# Patient Record
Sex: Female | Born: 1955 | Race: White | Hispanic: No
Health system: Midwestern US, Academic
[De-identification: ages and names within clinical notes are randomized; demographics above are authoritative.]

## PROBLEM LIST (undated history)

## (undated) DIAGNOSIS — I10 Essential (primary) hypertension: Secondary | ICD-10-CM

---

## 1996-01-03 HISTORY — PX: PARTIAL HYSTERECTOMY: SHX80

## 1999-12-06 ENCOUNTER — Encounter: Payer: Self-pay | Admitting: Internal Medicine

## 1999-12-06 ENCOUNTER — Encounter: Admission: RE | Admit: 1999-12-06 | Discharge: 1999-12-06 | Payer: Self-pay | Admitting: Internal Medicine

## 2000-09-17 ENCOUNTER — Other Ambulatory Visit: Admission: RE | Admit: 2000-09-17 | Discharge: 2000-09-17 | Payer: Self-pay | Admitting: Internal Medicine

## 2001-03-25 ENCOUNTER — Encounter: Payer: Self-pay | Admitting: Internal Medicine

## 2001-03-25 ENCOUNTER — Encounter: Admission: RE | Admit: 2001-03-25 | Discharge: 2001-03-25 | Payer: Self-pay | Admitting: Internal Medicine

## 2001-04-24 ENCOUNTER — Inpatient Hospital Stay (HOSPITAL_COMMUNITY): Admission: RE | Admit: 2001-04-24 | Discharge: 2001-04-27 | Payer: Self-pay | Admitting: Obstetrics and Gynecology

## 2001-09-03 ENCOUNTER — Emergency Department (HOSPITAL_COMMUNITY): Admission: EM | Admit: 2001-09-03 | Discharge: 2001-09-04 | Payer: Self-pay | Admitting: Emergency Medicine

## 2001-10-04 ENCOUNTER — Encounter: Payer: Self-pay | Admitting: Internal Medicine

## 2001-10-04 ENCOUNTER — Encounter: Admission: RE | Admit: 2001-10-04 | Discharge: 2001-10-04 | Payer: Self-pay | Admitting: Internal Medicine

## 2005-02-13 ENCOUNTER — Encounter: Admission: RE | Admit: 2005-02-13 | Discharge: 2005-02-13 | Payer: Self-pay | Admitting: Internal Medicine

## 2005-03-03 ENCOUNTER — Encounter: Admission: RE | Admit: 2005-03-03 | Discharge: 2005-03-03 | Payer: Self-pay | Admitting: Internal Medicine

## 2006-10-03 IMAGING — MG MM MAMMO SCREENING
4 series · 4 of 4 positions shown · non-contrast
Comparison: none

SCREENING MAMMOGRAM:
There is a  dense fibroglandular pattern.  No masses or malignant type calcifications are 
identified.  Compared with prior studies.

[R CC]
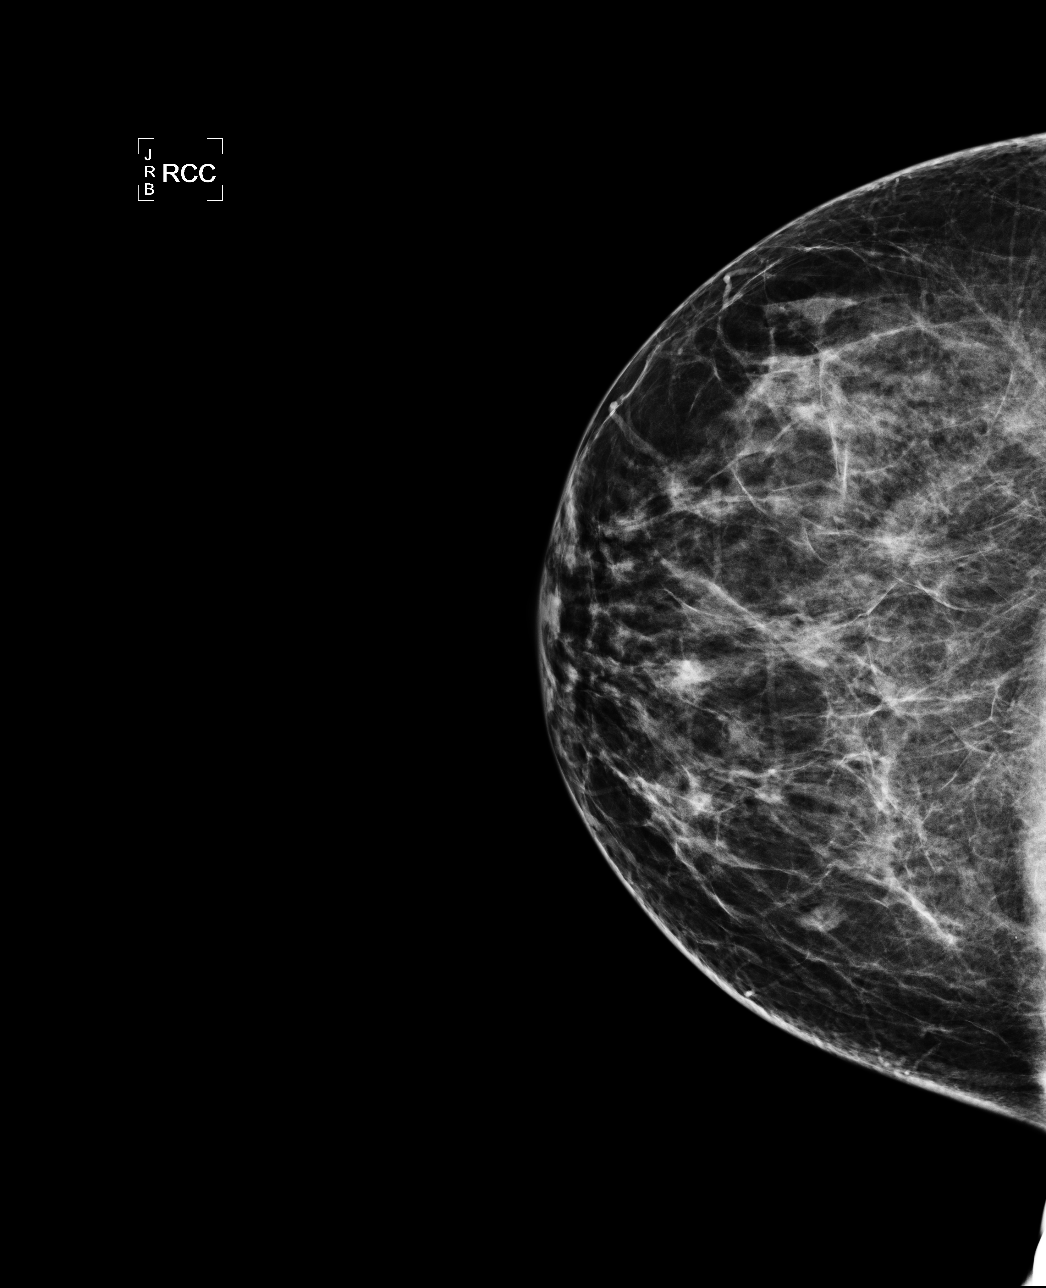

[L CC]
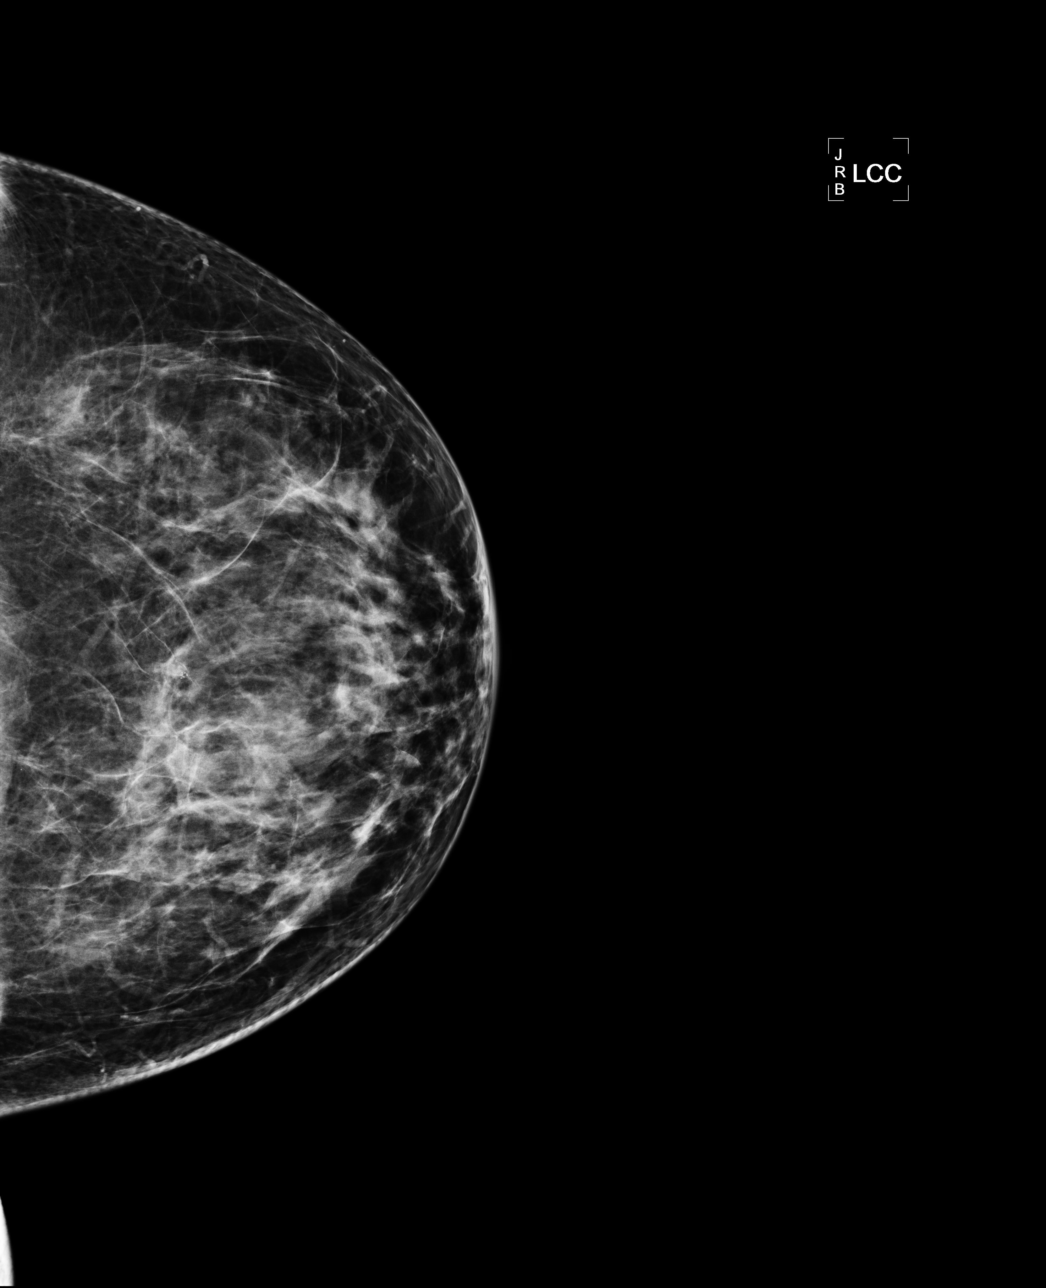

[L MLO]
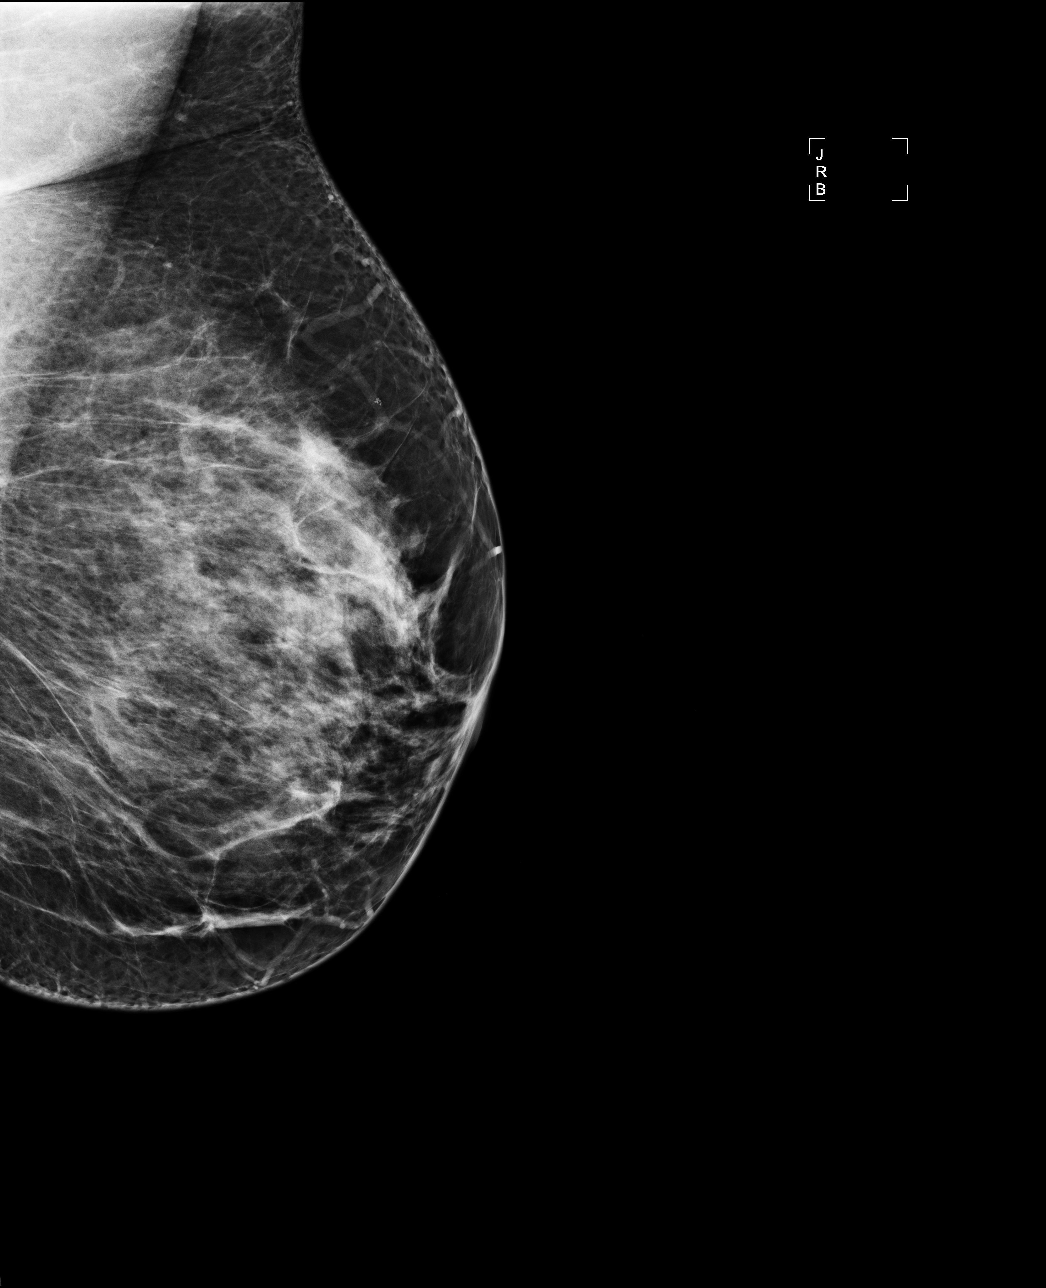

[R MLO]
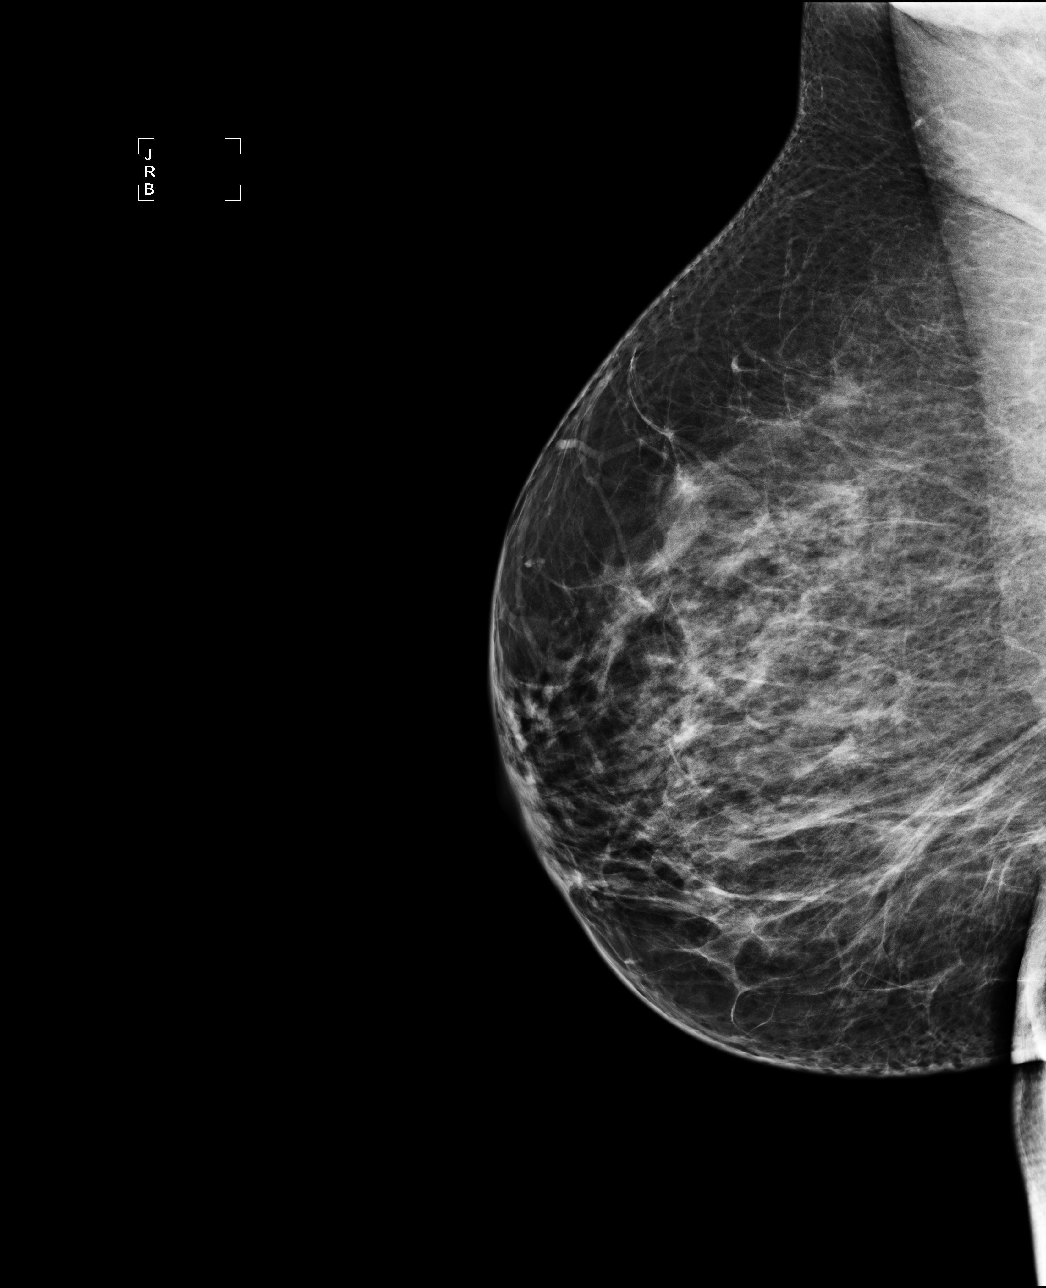

[4 of 4 positions shown; findings below may reference images not displayed]

IMPRESSION: No specific mammographic evidence of malignancy.  Next screening mammogram is recommended in one 
year.

ASSESSMENT: Negative - BI-RADS 1

Screening mammogram in 1 year.

## 2009-04-18 ENCOUNTER — Emergency Department: Payer: Self-pay | Admitting: Emergency Medicine

## 2009-09-03 ENCOUNTER — Emergency Department: Payer: Self-pay | Admitting: Emergency Medicine

## 2012-03-25 ENCOUNTER — Other Ambulatory Visit: Payer: Self-pay

## 2012-03-25 DIAGNOSIS — Z1231 Encounter for screening mammogram for malignant neoplasm of breast: Secondary | ICD-10-CM

## 2012-04-10 ENCOUNTER — Ambulatory Visit
Admission: RE | Admit: 2012-04-10 | Discharge: 2012-04-10 | Disposition: A | Discharge status: Home / Self Care | Payer: 59 | Source: Ambulatory Visit

## 2012-04-10 DIAGNOSIS — Z1231 Encounter for screening mammogram for malignant neoplasm of breast: Secondary | ICD-10-CM

## 2014-02-25 ENCOUNTER — Emergency Department: Payer: Self-pay | Admitting: Emergency Medicine

## 2014-07-22 ENCOUNTER — Other Ambulatory Visit: Payer: Self-pay

## 2014-07-22 ENCOUNTER — Ambulatory Visit
Admission: RE | Admit: 2014-07-22 | Discharge: 2014-07-22 | Disposition: A | Discharge status: Home / Self Care | Payer: Commercial Managed Care - HMO | Source: Ambulatory Visit

## 2014-07-22 DIAGNOSIS — Z1231 Encounter for screening mammogram for malignant neoplasm of breast: Secondary | ICD-10-CM

## 2014-07-24 ENCOUNTER — Other Ambulatory Visit: Payer: Self-pay | Admitting: Internal Medicine

## 2014-07-24 DIAGNOSIS — R928 Other abnormal and inconclusive findings on diagnostic imaging of breast: Secondary | ICD-10-CM

## 2014-10-02 ENCOUNTER — Ambulatory Visit
Admission: RE | Admit: 2014-10-02 | Discharge: 2014-10-02 | Disposition: A | Discharge status: Home / Self Care | Payer: Commercial Managed Care - HMO | Source: Ambulatory Visit | Attending: Internal Medicine | Admitting: Internal Medicine

## 2014-10-02 DIAGNOSIS — R928 Other abnormal and inconclusive findings on diagnostic imaging of breast: Secondary | ICD-10-CM

## 2015-06-05 DIAGNOSIS — I10 Essential (primary) hypertension: Secondary | ICD-10-CM | POA: Insufficient documentation

## 2015-09-13 ENCOUNTER — Other Ambulatory Visit: Payer: Self-pay | Admitting: Internal Medicine

## 2015-09-13 DIAGNOSIS — R1032 Left lower quadrant pain: Secondary | ICD-10-CM

## 2016-03-08 DIAGNOSIS — R1032 Left lower quadrant pain: Secondary | ICD-10-CM | POA: Diagnosis not present

## 2016-03-08 DIAGNOSIS — R7309 Other abnormal glucose: Secondary | ICD-10-CM | POA: Diagnosis not present

## 2016-03-08 DIAGNOSIS — I10 Essential (primary) hypertension: Secondary | ICD-10-CM | POA: Diagnosis not present

## 2016-03-13 ENCOUNTER — Other Ambulatory Visit: Payer: Commercial Managed Care - HMO

## 2016-03-17 ENCOUNTER — Other Ambulatory Visit: Payer: Commercial Managed Care - HMO

## 2016-03-21 ENCOUNTER — Ambulatory Visit
Admission: RE | Admit: 2016-03-21 | Discharge: 2016-03-21 | Disposition: A | Discharge status: Home / Self Care | Payer: Commercial Managed Care - HMO | Source: Ambulatory Visit | Attending: Internal Medicine | Admitting: Internal Medicine

## 2016-03-21 DIAGNOSIS — R1032 Left lower quadrant pain: Secondary | ICD-10-CM

## 2016-03-21 MED ORDER — IOPAMIDOL (ISOVUE-300) INJECTION 61%
125.0000 mL | Freq: Once | INTRAVENOUS | Status: AC | PRN
  Administered 2016-03-21: 125 mL via INTRAVENOUS

## 2016-08-28 ENCOUNTER — Emergency Department
Admission: EM | Admit: 2016-08-28 | Discharge: 2016-08-28 | Disposition: A | Discharge status: Home / Self Care | Payer: 59 | Attending: Emergency Medicine | Admitting: Emergency Medicine

## 2016-08-28 ENCOUNTER — Emergency Department: Payer: 59

## 2016-08-28 ENCOUNTER — Encounter: Payer: Self-pay | Admitting: Emergency Medicine

## 2016-08-28 DIAGNOSIS — Z79899 Other long term (current) drug therapy: Secondary | ICD-10-CM | POA: Insufficient documentation

## 2016-08-28 DIAGNOSIS — X501XXA Overexertion from prolonged static or awkward postures, initial encounter: Secondary | ICD-10-CM | POA: Diagnosis not present

## 2016-08-28 DIAGNOSIS — S82832A Other fracture of upper and lower end of left fibula, initial encounter for closed fracture: Secondary | ICD-10-CM | POA: Diagnosis not present

## 2016-08-28 DIAGNOSIS — S82122A Displaced fracture of lateral condyle of left tibia, initial encounter for closed fracture: Secondary | ICD-10-CM

## 2016-08-28 DIAGNOSIS — I10 Essential (primary) hypertension: Secondary | ICD-10-CM | POA: Diagnosis not present

## 2016-08-28 DIAGNOSIS — F17290 Nicotine dependence, other tobacco product, uncomplicated: Secondary | ICD-10-CM | POA: Insufficient documentation

## 2016-08-28 DIAGNOSIS — S99912A Unspecified injury of left ankle, initial encounter: Secondary | ICD-10-CM | POA: Diagnosis present

## 2016-08-28 DIAGNOSIS — Y999 Unspecified external cause status: Secondary | ICD-10-CM | POA: Diagnosis not present

## 2016-08-28 DIAGNOSIS — Y9301 Activity, walking, marching and hiking: Secondary | ICD-10-CM | POA: Diagnosis not present

## 2016-08-28 DIAGNOSIS — Y929 Unspecified place or not applicable: Secondary | ICD-10-CM | POA: Diagnosis not present

## 2016-08-28 DIAGNOSIS — W108XXA Fall (on) (from) other stairs and steps, initial encounter: Secondary | ICD-10-CM | POA: Diagnosis not present

## 2016-08-28 DIAGNOSIS — S82432A Displaced oblique fracture of shaft of left fibula, initial encounter for closed fracture: Secondary | ICD-10-CM

## 2016-08-28 HISTORY — DX: Essential (primary) hypertension: I10

## 2016-08-28 MED ORDER — MELOXICAM 15 MG PO TABS: 15.0000 mg | ORAL_TABLET | Freq: Every day | ORAL | 0 refills | Status: DC

## 2016-08-28 MED ORDER — OXYCODONE HCL 5 MG PO TABS
5.0000 mg | ORAL_TABLET | Freq: Once | ORAL | Status: AC
  Administered 2016-08-28: 5 mg via ORAL
  Filled 2016-08-28: qty 1

## 2016-08-28 MED ORDER — HYDROCODONE-ACETAMINOPHEN 5-325 MG PO TABS: 1.0000 | ORAL_TABLET | ORAL | 0 refills | Status: AC | PRN

## 2016-08-28 NOTE — ED Triage Notes (Signed)
Fell sown step about 30 min ago. Painful swollen L ankle.

## 2016-08-28 NOTE — Discharge Instructions (Signed)
Call and schedule an appointment with Dr. Cleda Mccreedy. Rest, ice, and elevate your leg often throughout the day. Use your crutches. Do not try and bear weight. Return to the ER for symptoms that change or worsen or for new concerns if you are unable to schedule an appointment.

## 2016-08-28 NOTE — ED Notes (Signed)
See triage note. Fell down steps   Twisted left ankle  Positive swelling  Good pulses and sensation

## 2016-08-28 NOTE — ED Provider Notes (Signed)
[facility] Emergency Department Provider Note ____________________________________________  Time seen: Approximately 4:01 PM  I have reviewed the triage vital signs and the nursing notes.   HISTORY  Chief Complaint Ankle Pain   HPI Carolyn Baker is a 61 y.o. female who presents to the emergency department for evaluation of left ankle pain after missing a step and twisting her ankle while walking down her basement stairs. She denies pain elsewhere or striking her head. No history of ankle fracture. No alleviating measures have been attempted for this complaint.   Past Medical History:  Diagnosis Date  . Hypertension     There are no active problems to display for this patient.   History reviewed. No pertinent surgical history.  Prior to Admission medications   Medication Sig Start Date End Date Taking? Authorizing Provider  amLODipine-benazepril (LOTREL) 5-40 MG capsule Take 1 capsule by mouth daily.   Yes [provider]  hydrochlorothiazide (MICROZIDE) 12.5 MG capsule Take 12.5 mg by mouth daily.   Yes [provider]  HYDROcodone-acetaminophen (NORCO/VICODIN) 5-325 MG tablet Take 1 tablet by mouth every 4 (four) hours as needed for moderate pain. 08/28/16 08/28/17  Kalie Cabral, Johnette Abraham B, FNP  meloxicam (MOBIC) 15 MG tablet Take 1 tablet (15 mg total) by mouth daily. 08/28/16   Victorino Dike, FNP    Allergies Patient has no known allergies.  No family history on file.  Social History Social History  Substance Use Topics  . Smoking status: Current Every Day Smoker    Types: Cigarettes  . Smokeless tobacco: Not on file  . Alcohol use Not on file    Review of Systems Constitutional: Negative for recent illness. Cardiovascular: Negative for chest pain Respiratory: Negative for shortness of breath. Musculoskeletal: Positive for left foot and ankle pain. Skin: Negative for open wound.  Neurological: Negative for loss of  consciousness, numbness, or paresthesias.  ____________________________________________   PHYSICAL EXAM:  VITAL SIGNS: ED Triage Vitals [08/28/16 1501]  Enc Vitals Group     BP (!) 162/92     Pulse Rate (!) 101     Resp 20     Temp 98.1 F (36.7 C)     Temp Source Oral     SpO2 99 %     Weight 175 lb (79.4 kg)     Height 5\' 3"  (1.6 m)     Head Circumference      Peak Flow      Pain Score 10     Pain Loc      Pain Edu?      Excl. in [place]?     Constitutional: Alert and oriented. Well appearing and in no acute distress. Eyes: Conjunctivae are clear without discharge or drainage.  Head: Atraumatic Neck: Nexus criteria is negative. Respiratory: Breath sounds clear to auscultation throughout. Musculoskeletal: Bony tenderness over the left lateral and medial malleolus without obvious deformity. No tenderness over the midfoot. No tenderness along the proximal fibula or tibia. Full range of motion of the left knee and hip without pain. Neurologic: Alert and oriented 4.  Skin: No open wounds noted over the left ankle or foot.  Psychiatric: Behavior and affect are appropriate.  ____________________________________________   LABS (all labs ordered are listed, but only abnormal results are displayed)  Labs Reviewed - No data to display ____________________________________________  RADIOLOGY  Left ankle demonstrates an acute minimally displaced spiral fracture of the distal left fibula and a small avulsion fracture from the anteriomedial aspect of the  tibia. I, Sherrie George, personally viewed and evaluated these images (plain radiographs) as part of my medical decision making, as well as reviewing the written report by the radiologist. ____________________________________________   PROCEDURES  Procedure(s) performed: Stirrup and posterior OCL applied by ER tech. Patient neurovascularly intact post-application. Initial fracture care provided. Follow-up will be greater than 24  hours.  ____________________________________________   INITIAL IMPRESSION / ASSESSMENT AND PLAN / ED COURSE  Carolyn Baker is a 61 y.o. female who presents to the emergency department for evaluation after sustaining a mechanical, non-syncopal fall while walking down her basement stairs this afternoon. X-ray demonstrates a fracture of both the distal fibula and tibia. She was immobilized with an OCL and given crutches with training. She was instructed to call and schedule a follow-up appointment with podiatry. She will be given a prescription for Norco and meloxicam. She was instructed to return to the emergency department for symptoms that change or worsen if she is unable to be seen by her primary care provider or the orthopedic specialist.  Pertinent labs & imaging results that were available during my care of the patient were reviewed by me and considered in my medical decision making (see chart for details).  _________________________________________   FINAL CLINICAL IMPRESSION(S) / ED DIAGNOSES  Final diagnoses:  Closed displaced oblique fracture of shaft of left fibula, initial encounter  Avulsion fracture of lateral condyle of left tibia, closed, initial encounter    Discharge Medication List as of 08/28/2016  3:58 PM    START taking these medications   Details  HYDROcodone-acetaminophen (NORCO/VICODIN) 5-325 MG tablet Take 1 tablet by mouth every 4 (four) hours as needed for moderate pain., Starting Mon 08/28/2016, Until Tue 08/28/2017, Print    meloxicam (MOBIC) 15 MG tablet Take 1 tablet (15 mg total) by mouth daily., Starting Mon 08/28/2016, Print        If controlled substance prescribed during this visit, 12 month history viewed on the [place] prior to issuing an initial prescription for Schedule II or III opiod.    Victorino Dike, FNP 08/28/16 [place], [place], MD 08/30/16 [phone]

## 2016-08-31 ENCOUNTER — Other Ambulatory Visit: Payer: Self-pay | Admitting: Podiatry

## 2016-08-31 ENCOUNTER — Ambulatory Visit
Admission: RE | Admit: 2016-08-31 | Discharge: 2016-08-31 | Disposition: A | Discharge status: Home / Self Care | Payer: 59 | Source: Ambulatory Visit | Attending: Podiatry | Admitting: Podiatry

## 2016-08-31 DIAGNOSIS — M79605 Pain in left leg: Secondary | ICD-10-CM | POA: Diagnosis not present

## 2016-08-31 DIAGNOSIS — M7989 Other specified soft tissue disorders: Secondary | ICD-10-CM

## 2016-08-31 DIAGNOSIS — S82842A Displaced bimalleolar fracture of left lower leg, initial encounter for closed fracture: Secondary | ICD-10-CM | POA: Diagnosis not present

## 2016-08-31 DIAGNOSIS — I824Z2 Acute embolism and thrombosis of unspecified deep veins of left distal lower extremity: Secondary | ICD-10-CM

## 2016-09-07 ENCOUNTER — Ambulatory Visit
Admission: RE | Admit: 2016-09-07 | Discharge: 2016-09-07 | Disposition: A | Discharge status: Home / Self Care | Payer: 59 | Source: Ambulatory Visit | Attending: Podiatry | Admitting: Podiatry

## 2016-09-07 DIAGNOSIS — I82402 Acute embolism and thrombosis of unspecified deep veins of left lower extremity: Secondary | ICD-10-CM | POA: Diagnosis not present

## 2016-09-07 DIAGNOSIS — Z09 Encounter for follow-up examination after completed treatment for conditions other than malignant neoplasm: Secondary | ICD-10-CM | POA: Insufficient documentation

## 2016-09-07 DIAGNOSIS — I824Z2 Acute embolism and thrombosis of unspecified deep veins of left distal lower extremity: Secondary | ICD-10-CM | POA: Diagnosis not present

## 2016-09-07 DIAGNOSIS — Z86718 Personal history of other venous thrombosis and embolism: Secondary | ICD-10-CM | POA: Diagnosis not present

## 2016-09-08 DIAGNOSIS — Z79899 Other long term (current) drug therapy: Secondary | ICD-10-CM | POA: Diagnosis not present

## 2016-09-08 DIAGNOSIS — I10 Essential (primary) hypertension: Secondary | ICD-10-CM | POA: Diagnosis not present

## 2016-09-08 DIAGNOSIS — I82402 Acute embolism and thrombosis of unspecified deep veins of left lower extremity: Secondary | ICD-10-CM | POA: Diagnosis not present

## 2016-09-08 DIAGNOSIS — S8292XS Unspecified fracture of left lower leg, sequela: Secondary | ICD-10-CM | POA: Diagnosis not present

## 2016-09-16 DIAGNOSIS — M25472 Effusion, left ankle: Secondary | ICD-10-CM | POA: Diagnosis not present

## 2016-09-16 DIAGNOSIS — S9002XA Contusion of left ankle, initial encounter: Secondary | ICD-10-CM | POA: Diagnosis not present

## 2016-09-16 DIAGNOSIS — S82402A Unspecified fracture of shaft of left fibula, initial encounter for closed fracture: Secondary | ICD-10-CM | POA: Diagnosis not present

## 2016-09-16 DIAGNOSIS — S82832D Other fracture of upper and lower end of left fibula, subsequent encounter for closed fracture with routine healing: Secondary | ICD-10-CM | POA: Diagnosis not present

## 2016-09-16 DIAGNOSIS — M25572 Pain in left ankle and joints of left foot: Secondary | ICD-10-CM | POA: Diagnosis not present

## 2016-09-16 DIAGNOSIS — I82402 Acute embolism and thrombosis of unspecified deep veins of left lower extremity: Secondary | ICD-10-CM | POA: Diagnosis not present

## 2016-09-16 DIAGNOSIS — S8255XA Nondisplaced fracture of medial malleolus of left tibia, initial encounter for closed fracture: Secondary | ICD-10-CM | POA: Diagnosis not present

## 2016-09-16 DIAGNOSIS — S8255XD Nondisplaced fracture of medial malleolus of left tibia, subsequent encounter for closed fracture with routine healing: Secondary | ICD-10-CM | POA: Diagnosis not present

## 2016-09-21 DIAGNOSIS — M25572 Pain in left ankle and joints of left foot: Secondary | ICD-10-CM | POA: Diagnosis not present

## 2016-09-21 DIAGNOSIS — S82842D Displaced bimalleolar fracture of left lower leg, subsequent encounter for closed fracture with routine healing: Secondary | ICD-10-CM | POA: Diagnosis not present

## 2016-09-21 DIAGNOSIS — I824Z2 Acute embolism and thrombosis of unspecified deep veins of left distal lower extremity: Secondary | ICD-10-CM | POA: Diagnosis not present

## 2016-10-11 DIAGNOSIS — Z0000 Encounter for general adult medical examination without abnormal findings: Secondary | ICD-10-CM | POA: Diagnosis not present

## 2016-10-11 DIAGNOSIS — I10 Essential (primary) hypertension: Secondary | ICD-10-CM | POA: Diagnosis not present

## 2016-10-11 DIAGNOSIS — I82402 Acute embolism and thrombosis of unspecified deep veins of left lower extremity: Secondary | ICD-10-CM | POA: Diagnosis not present

## 2016-10-12 DIAGNOSIS — I824Z2 Acute embolism and thrombosis of unspecified deep veins of left distal lower extremity: Secondary | ICD-10-CM | POA: Diagnosis not present

## 2016-10-12 DIAGNOSIS — S82842D Displaced bimalleolar fracture of left lower leg, subsequent encounter for closed fracture with routine healing: Secondary | ICD-10-CM | POA: Diagnosis not present

## 2016-10-20 ENCOUNTER — Other Ambulatory Visit: Payer: Self-pay | Admitting: Internal Medicine

## 2016-10-20 DIAGNOSIS — Z1231 Encounter for screening mammogram for malignant neoplasm of breast: Secondary | ICD-10-CM

## 2016-11-08 DIAGNOSIS — M25672 Stiffness of left ankle, not elsewhere classified: Secondary | ICD-10-CM | POA: Diagnosis not present

## 2016-11-08 DIAGNOSIS — S82842D Displaced bimalleolar fracture of left lower leg, subsequent encounter for closed fracture with routine healing: Secondary | ICD-10-CM | POA: Diagnosis not present

## 2016-11-08 DIAGNOSIS — M25572 Pain in left ankle and joints of left foot: Secondary | ICD-10-CM | POA: Diagnosis not present

## 2016-11-14 DIAGNOSIS — S82842D Displaced bimalleolar fracture of left lower leg, subsequent encounter for closed fracture with routine healing: Secondary | ICD-10-CM | POA: Diagnosis not present

## 2016-11-17 DIAGNOSIS — M25672 Stiffness of left ankle, not elsewhere classified: Secondary | ICD-10-CM | POA: Diagnosis not present

## 2016-11-17 DIAGNOSIS — S82842D Displaced bimalleolar fracture of left lower leg, subsequent encounter for closed fracture with routine healing: Secondary | ICD-10-CM | POA: Diagnosis not present

## 2016-11-17 DIAGNOSIS — M6281 Muscle weakness (generalized): Secondary | ICD-10-CM | POA: Diagnosis not present

## 2016-11-20 DIAGNOSIS — M25572 Pain in left ankle and joints of left foot: Secondary | ICD-10-CM | POA: Diagnosis not present

## 2016-11-20 DIAGNOSIS — M25672 Stiffness of left ankle, not elsewhere classified: Secondary | ICD-10-CM | POA: Diagnosis not present

## 2016-11-20 DIAGNOSIS — S82842D Displaced bimalleolar fracture of left lower leg, subsequent encounter for closed fracture with routine healing: Secondary | ICD-10-CM | POA: Diagnosis not present

## 2016-11-22 ENCOUNTER — Ambulatory Visit
Admission: RE | Admit: 2016-11-22 | Discharge: 2016-11-22 | Disposition: A | Discharge status: Home / Self Care | Payer: 59 | Source: Ambulatory Visit | Attending: Internal Medicine | Admitting: Internal Medicine

## 2016-11-22 DIAGNOSIS — Z1231 Encounter for screening mammogram for malignant neoplasm of breast: Secondary | ICD-10-CM | POA: Diagnosis not present

## 2016-11-27 DIAGNOSIS — S82842D Displaced bimalleolar fracture of left lower leg, subsequent encounter for closed fracture with routine healing: Secondary | ICD-10-CM | POA: Diagnosis not present

## 2016-11-27 DIAGNOSIS — M25572 Pain in left ankle and joints of left foot: Secondary | ICD-10-CM | POA: Diagnosis not present

## 2016-11-27 DIAGNOSIS — M25672 Stiffness of left ankle, not elsewhere classified: Secondary | ICD-10-CM | POA: Diagnosis not present

## 2016-11-28 DIAGNOSIS — S82842D Displaced bimalleolar fracture of left lower leg, subsequent encounter for closed fracture with routine healing: Secondary | ICD-10-CM | POA: Diagnosis not present

## 2016-11-28 DIAGNOSIS — M25572 Pain in left ankle and joints of left foot: Secondary | ICD-10-CM | POA: Diagnosis not present

## 2016-11-29 DIAGNOSIS — S82842D Displaced bimalleolar fracture of left lower leg, subsequent encounter for closed fracture with routine healing: Secondary | ICD-10-CM | POA: Diagnosis not present

## 2016-11-29 DIAGNOSIS — M25572 Pain in left ankle and joints of left foot: Secondary | ICD-10-CM | POA: Diagnosis not present

## 2016-11-29 DIAGNOSIS — M25672 Stiffness of left ankle, not elsewhere classified: Secondary | ICD-10-CM | POA: Diagnosis not present

## 2016-12-04 DIAGNOSIS — M25672 Stiffness of left ankle, not elsewhere classified: Secondary | ICD-10-CM | POA: Diagnosis not present

## 2016-12-04 DIAGNOSIS — S82842D Displaced bimalleolar fracture of left lower leg, subsequent encounter for closed fracture with routine healing: Secondary | ICD-10-CM | POA: Diagnosis not present

## 2016-12-04 DIAGNOSIS — M25572 Pain in left ankle and joints of left foot: Secondary | ICD-10-CM | POA: Diagnosis not present

## 2017-01-11 DIAGNOSIS — I82402 Acute embolism and thrombosis of unspecified deep veins of left lower extremity: Secondary | ICD-10-CM | POA: Diagnosis not present

## 2017-01-11 DIAGNOSIS — E559 Vitamin D deficiency, unspecified: Secondary | ICD-10-CM | POA: Diagnosis not present

## 2017-01-11 DIAGNOSIS — I10 Essential (primary) hypertension: Secondary | ICD-10-CM | POA: Diagnosis not present

## 2017-05-14 DIAGNOSIS — M25572 Pain in left ankle and joints of left foot: Secondary | ICD-10-CM | POA: Diagnosis not present

## 2017-05-14 DIAGNOSIS — Z1389 Encounter for screening for other disorder: Secondary | ICD-10-CM | POA: Diagnosis not present

## 2017-05-14 DIAGNOSIS — Z86718 Personal history of other venous thrombosis and embolism: Secondary | ICD-10-CM | POA: Diagnosis not present

## 2017-05-14 DIAGNOSIS — I10 Essential (primary) hypertension: Secondary | ICD-10-CM | POA: Diagnosis not present

## 2017-10-19 ENCOUNTER — Other Ambulatory Visit: Payer: Self-pay | Admitting: Internal Medicine

## 2017-11-15 ENCOUNTER — Other Ambulatory Visit: Payer: Self-pay | Admitting: Internal Medicine

## 2017-11-15 MED ORDER — AMLODIPINE BESY-BENAZEPRIL HCL 5-40 MG PO CAPS: 1.0000 | ORAL_CAPSULE | Freq: Every day | ORAL | 1 refills | Status: DC

## 2017-11-15 MED ORDER — HYDROCHLOROTHIAZIDE 12.5 MG PO CAPS: 12.5000 mg | ORAL_CAPSULE | Freq: Every day | ORAL | 1 refills | Status: DC

## 2017-11-22 ENCOUNTER — Other Ambulatory Visit (HOSPITAL_COMMUNITY)
Admission: RE | Admit: 2017-11-22 | Discharge: 2017-11-22 | Disposition: A | Discharge status: Home / Self Care | Payer: 59 | Source: Ambulatory Visit | Attending: Internal Medicine | Admitting: Internal Medicine

## 2017-11-22 ENCOUNTER — Other Ambulatory Visit: Payer: Self-pay

## 2017-11-22 ENCOUNTER — Encounter: Payer: Self-pay | Admitting: Internal Medicine

## 2017-11-22 ENCOUNTER — Ambulatory Visit (INDEPENDENT_AMBULATORY_CARE_PROVIDER_SITE_OTHER): Payer: 59 | Admitting: Internal Medicine

## 2017-11-22 VITALS — BP 150/88 | HR 86 | Temp 97.7°F | Ht 63.75 in | Wt 199.4 lb

## 2017-11-22 DIAGNOSIS — Z90710 Acquired absence of both cervix and uterus: Secondary | ICD-10-CM | POA: Diagnosis not present

## 2017-11-22 DIAGNOSIS — Z01419 Encounter for gynecological examination (general) (routine) without abnormal findings: Secondary | ICD-10-CM | POA: Insufficient documentation

## 2017-11-22 DIAGNOSIS — I10 Essential (primary) hypertension: Secondary | ICD-10-CM | POA: Diagnosis not present

## 2017-11-22 DIAGNOSIS — M25572 Pain in left ankle and joints of left foot: Secondary | ICD-10-CM | POA: Insufficient documentation

## 2017-11-22 DIAGNOSIS — K921 Melena: Secondary | ICD-10-CM | POA: Diagnosis not present

## 2017-11-22 DIAGNOSIS — Z86010 Personal history of colonic polyps: Secondary | ICD-10-CM | POA: Diagnosis not present

## 2017-11-22 DIAGNOSIS — Z1239 Encounter for other screening for malignant neoplasm of breast: Secondary | ICD-10-CM | POA: Diagnosis not present

## 2017-11-22 DIAGNOSIS — H538 Other visual disturbances: Secondary | ICD-10-CM | POA: Diagnosis not present

## 2017-11-22 DIAGNOSIS — Z1212 Encounter for screening for malignant neoplasm of rectum: Secondary | ICD-10-CM

## 2017-11-22 DIAGNOSIS — Z0000 Encounter for general adult medical examination without abnormal findings: Secondary | ICD-10-CM | POA: Diagnosis not present

## 2017-11-22 LAB — POCT URINALYSIS DIPSTICK
Bilirubin, UA: NEGATIVE
Glucose, UA: NEGATIVE
KETONES UA: NEGATIVE
NITRITE UA: NEGATIVE
PROTEIN UA: NEGATIVE
SPEC GRAV UA: 1.025 (ref 1.010–1.025)
Urobilinogen, UA: 0.2 E.U./dL
pH, UA: 5.5 (ref 5.0–8.0)

## 2017-11-22 LAB — POCT UA - MICROALBUMIN
ALBUMIN/CREATININE RATIO, URINE, POC: 30
Creatinine, POC: 300 mg/dL
Microalbumin Ur, POC: 30 mg/L

## 2017-11-22 MED ORDER — AMLODIPINE BESY-BENAZEPRIL HCL 5-40 MG PO CAPS: 1.0000 | ORAL_CAPSULE | Freq: Every day | ORAL | 1 refills | Status: DC

## 2017-11-22 NOTE — Patient Instructions (Signed)
DASH Eating Plan DASH stands for "Dietary Approaches to Stop Hypertension." The DASH eating plan is a healthy eating plan that has been shown to reduce high blood pressure (hypertension). It may also reduce your risk for type 2 diabetes, heart disease, and stroke. The DASH eating plan may also help with weight loss. What are tips for following this plan? General guidelines  Avoid eating more than 2,300 mg (milligrams) of salt (sodium) a day. If you have hypertension, you may need to reduce your sodium intake to 1,500 mg a day.  Limit alcohol intake to no more than 1 drink a day for nonpregnant women and 2 drinks a day for men. One drink equals 12 oz of beer, 5 oz of wine, or 1 oz of hard liquor.  Work with your health care provider to maintain a healthy body weight or to lose weight. Ask what an ideal weight is for you.  Get at least 30 minutes of exercise that causes your heart to beat faster (aerobic exercise) most days of the week. Activities may include walking, swimming, or biking.  Work with your health care provider or diet and nutrition specialist (dietitian) to adjust your eating plan to your individual calorie needs. Reading food labels  Check food labels for the amount of sodium per serving. Choose foods with less than 5 percent of the Daily Value of sodium. Generally, foods with less than 300 mg of sodium per serving fit into this eating plan.  To find whole grains, look for the word "whole" as the first word in the ingredient list. Shopping  Buy products labeled as "low-sodium" or "no salt added."  Buy fresh foods. Avoid canned foods and premade or frozen meals. Cooking  Avoid adding salt when cooking. Use salt-free seasonings or herbs instead of table salt or sea salt. Check with your health care provider or pharmacist before using salt substitutes.  Do not fry foods. Cook foods using healthy methods such as baking, boiling, grilling, and broiling instead.  Cook with  heart-healthy oils, such as olive, canola, soybean, or sunflower oil. Meal planning   Eat a balanced diet that includes: ? 5 or more servings of fruits and vegetables each day. At each meal, try to fill half of your plate with fruits and vegetables. ? Up to 6-8 servings of whole grains each day. ? Less than 6 oz of lean meat, poultry, or fish each day. A 3-oz serving of meat is about the same size as a deck of cards. One egg equals 1 oz. ? 2 servings of low-fat dairy each day. ? A serving of nuts, seeds, or beans 5 times each week. ? Heart-healthy fats. Healthy fats called Omega-3 fatty acids are found in foods such as flaxseeds and coldwater fish, like sardines, salmon, and mackerel.  Limit how much you eat of the following: ? Canned or prepackaged foods. ? Food that is high in trans fat, such as fried foods. ? Food that is high in saturated fat, such as fatty meat. ? Sweets, desserts, sugary drinks, and other foods with added sugar. ? Full-fat dairy products.  Do not salt foods before eating.  Try to eat at least 2 vegetarian meals each week.  Eat more home-cooked food and less restaurant, buffet, and fast food.  When eating at a restaurant, ask that your food be prepared with less salt or no salt, if possible. What foods are recommended? The items listed may not be a complete list. Talk with your dietitian about what   dietary choices are best for you. Grains Whole-grain or whole-wheat bread. Whole-grain or whole-wheat pasta. Brown rice. Oatmeal. Quinoa. Bulgur. Whole-grain and low-sodium cereals. Pita bread. Low-fat, low-sodium crackers. Whole-wheat flour tortillas. Vegetables Fresh or frozen vegetables (raw, steamed, roasted, or grilled). Low-sodium or reduced-sodium tomato and vegetable juice. Low-sodium or reduced-sodium tomato sauce and tomato paste. Low-sodium or reduced-sodium canned vegetables. Fruits All fresh, dried, or frozen fruit. Canned fruit in natural juice (without  added sugar). Meat and other protein foods Skinless chicken or turkey. Ground chicken or turkey. Pork with fat trimmed off. Fish and seafood. Egg whites. Dried beans, peas, or lentils. Unsalted nuts, nut butters, and seeds. Unsalted canned beans. Lean cuts of beef with fat trimmed off. Low-sodium, lean deli meat. Dairy Low-fat (1%) or fat-free (skim) milk. Fat-free, low-fat, or reduced-fat cheeses. Nonfat, low-sodium ricotta or cottage cheese. Low-fat or nonfat yogurt. Low-fat, low-sodium cheese. Fats and oils Soft margarine without trans fats. Vegetable oil. Low-fat, reduced-fat, or light mayonnaise and salad dressings (reduced-sodium). Canola, safflower, olive, soybean, and sunflower oils. Avocado. Seasoning and other foods Herbs. Spices. Seasoning mixes without salt. Unsalted popcorn and pretzels. Fat-free sweets. What foods are not recommended? The items listed may not be a complete list. Talk with your dietitian about what dietary choices are best for you. Grains Baked goods made with fat, such as croissants, muffins, or some breads. Dry pasta or rice meal packs. Vegetables Creamed or fried vegetables. Vegetables in a cheese sauce. Regular canned vegetables (not low-sodium or reduced-sodium). Regular canned tomato sauce and paste (not low-sodium or reduced-sodium). Regular tomato and vegetable juice (not low-sodium or reduced-sodium). Pickles. Olives. Fruits Canned fruit in a light or heavy syrup. Fried fruit. Fruit in cream or butter sauce. Meat and other protein foods Fatty cuts of meat. Ribs. Fried meat. Bacon. Sausage. Bologna and other processed lunch meats. Salami. Fatback. Hotdogs. Bratwurst. Salted nuts and seeds. Canned beans with added salt. Canned or smoked fish. Whole eggs or egg yolks. Chicken or turkey with skin. Dairy Whole or 2% milk, cream, and half-and-half. Whole or full-fat cream cheese. Whole-fat or sweetened yogurt. Full-fat cheese. Nondairy creamers. Whipped toppings.  Processed cheese and cheese spreads. Fats and oils Butter. Stick margarine. Lard. Shortening. Ghee. Bacon fat. Tropical oils, such as coconut, palm kernel, or palm oil. Seasoning and other foods Salted popcorn and pretzels. Onion salt, garlic salt, seasoned salt, table salt, and sea salt. Worcestershire sauce. Tartar sauce. Barbecue sauce. Teriyaki sauce. Soy sauce, including reduced-sodium. Steak sauce. Canned and packaged gravies. Fish sauce. Oyster sauce. Cocktail sauce. Horseradish that you find on the shelf. Ketchup. Mustard. Meat flavorings and tenderizers. Bouillon cubes. Hot sauce and Tabasco sauce. Premade or packaged marinades. Premade or packaged taco seasonings. Relishes. Regular salad dressings. Where to find more information:  National Heart, Lung, and Blood Institute: www.nhlbi.nih.gov  American Heart Association: www.heart.org Summary  The DASH eating plan is a healthy eating plan that has been shown to reduce high blood pressure (hypertension). It may also reduce your risk for type 2 diabetes, heart disease, and stroke.  With the DASH eating plan, you should limit salt (sodium) intake to 2,300 mg a day. If you have hypertension, you may need to reduce your sodium intake to 1,500 mg a day.  When on the DASH eating plan, aim to eat more fresh fruits and vegetables, whole grains, lean proteins, low-fat dairy, and heart-healthy fats.  Work with your health care provider or diet and nutrition specialist (dietitian) to adjust your eating plan to your individual   calorie needs. This information is not intended to replace advice given to you by your health care provider. Make sure you discuss any questions you have with your health care provider. Document Released: 12/08/2010 Document Revised: 12/13/2015 Document Reviewed: 12/13/2015 Elsevier Interactive Patient Education  2018 Elsevier Inc.  

## 2017-11-23 LAB — CMP14+EGFR
ALBUMIN: 4.1 g/dL (ref 3.6–4.8)
ALT: 22 IU/L (ref 0–32)
AST: 17 IU/L (ref 0–40)
Albumin/Globulin Ratio: 1.2 (ref 1.2–2.2)
Alkaline Phosphatase: 58 IU/L (ref 39–117)
BUN/Creatinine Ratio: 15 (ref 12–28)
BUN: 15 mg/dL (ref 8–27)
Bilirubin Total: 0.4 mg/dL (ref 0.0–1.2)
CALCIUM: 9.4 mg/dL (ref 8.7–10.3)
CHLORIDE: 103 mmol/L (ref 96–106)
CO2: 21 mmol/L (ref 20–29)
Creatinine, Ser: 1.03 mg/dL — ABNORMAL HIGH (ref 0.57–1.00)
GFR, EST AFRICAN AMERICAN: 68 mL/min/{1.73_m2} (ref >59)
GFR, EST NON AFRICAN AMERICAN: 59 mL/min/{1.73_m2} — AB (ref >59)
GLUCOSE: 97 mg/dL (ref 65–99)
Globulin, Total: 3.5 g/dL (ref 1.5–4.5)
Potassium: 4.4 mmol/L (ref 3.5–5.2)
Sodium: 140 mmol/L (ref 134–144)
TOTAL PROTEIN: 7.6 g/dL (ref 6.0–8.5)

## 2017-11-23 LAB — CBC
Hematocrit: 43.2 % (ref 34.0–46.6)
Hemoglobin: 14.5 g/dL (ref 11.1–15.9)
MCH: 31 pg (ref 26.6–33.0)
MCHC: 33.6 g/dL (ref 31.5–35.7)
MCV: 92 fL (ref 79–97)
Platelets: 191 10*3/uL (ref 150–450)
RBC: 4.68 x10E6/uL (ref 3.77–5.28)
RDW: 13.2 % (ref 12.3–15.4)
WBC: 3.9 10*3/uL (ref 3.4–10.8)

## 2017-11-23 LAB — LIPID PANEL
Chol/HDL Ratio: 3.7 ratio (ref 0.0–4.4)
Cholesterol, Total: 197 mg/dL (ref 100–199)
HDL: 53 mg/dL (ref >39)
LDL Calculated: 132 mg/dL — ABNORMAL HIGH (ref 0–99)
Triglycerides: 61 mg/dL (ref 0–149)
VLDL Cholesterol Cal: 12 mg/dL (ref 5–40)

## 2017-11-23 LAB — HEMOGLOBIN A1C
Est. average glucose Bld gHb Est-mCnc: 114 mg/dL
Hgb A1c MFr Bld: 5.6 % (ref 4.8–5.6)

## 2017-11-23 LAB — HIV ANTIBODY (ROUTINE TESTING W REFLEX): HIV SCREEN 4TH GENERATION: NONREACTIVE

## 2017-11-23 NOTE — Progress Notes (Signed)
Your liver and kidney fxn are stable. However, it appears your kidney function has decreased since last visit. Please increase water intake. Your blood count is normal. Your LDL, bad chol, is 132.  Ideally, this should be less than 100. Please start a regular exercise program - 30 minutes four to five days weekly. I also suggest you avoid fried foods. You are NOT prediabetic. You are HIV negative. Happy Thanksgiving to you and your family!

## 2017-11-24 LAB — POC HEMOCCULT BLD/STL (OFFICE/1-CARD/DIAGNOSTIC)

## 2017-11-24 NOTE — Progress Notes (Signed)
Subjective:     Patient ID: Carolyn Baker , female    DOB: 24-Feb-1955 , 62 y.o.   MRN: [id number]   Chief Complaint  Patient presents with  . Annual Exam  . Hypertension    HPI  She is here today for a full physical examination. She is no longer followed by GYN.   Hypertension  This is a chronic problem. The current episode started more than 1 year ago. The problem has been gradually improving since onset. The problem is uncontrolled. Pertinent negatives include no blurred vision, chest pain, headaches, neck pain, PND or shortness of breath. Risk factors for coronary artery disease include obesity and sedentary lifestyle.   She admits that she has not been exercising recently. She is helping to care for a friend with leukemia. She takes him to his treatments at least four days per week. She states she has yet to take her meds today.   Past Medical History:  Diagnosis Date  . Hypertension      Family History  Problem Relation Age of Onset  . Stroke Mother   . Dementia Mother   . Prostate cancer Father   . Breast cancer Neg Hx      No LMP recorded. Patient has had a hysterectomy..  Negative for: breast discharge, breast lump(s), breast pain and breast self exam. Associated symptoms include abnormal vaginal bleeding. Pertinent negatives include abnormal bleeding (hematology), anxiety, decreased libido, depression, difficulty falling sleep, dyspareunia, history of infertility, nocturia, sexual dysfunction, sleep disturbances, urinary incontinence, urinary urgency, vaginal discharge and vaginal itching. Diet regular.The patient states her exercise level is    . The patient's tobacco use is:  Social History   Tobacco Use  Smoking Status Never Smoker  Smokeless Tobacco Never Used  . She has been exposed to passive smoke. The patient's alcohol use is:  Social History   Substance and Sexual Activity  Alcohol Use Not on file    Current Outpatient Medications:  .   amLODipine-benazepril (LOTREL) 5-40 MG capsule, Take 1 capsule by mouth daily., Disp: 90 capsule, Rfl: 1 .  Cholecalciferol (VITAMIN D3) 125 MCG (5000 UT) CAPS, Take by mouth., Disp: , Rfl:  .  hydrochlorothiazide (MICROZIDE) 12.5 MG capsule, Take 1 capsule (12.5 mg total) by mouth daily., Disp: 90 capsule, Rfl: 1   No Known Allergies   Review of Systems  Constitutional: Negative.   HENT: Negative.   Eyes: Positive for visual disturbance (She reports that she has noticed that her vision has become more blurred.  She has trouble reading things farther away, and sometimes close up. does not have an established eye doctor. No associated h/a. ). Negative for blurred vision.  Respiratory: Negative.  Negative for shortness of breath.   Cardiovascular: Negative.  Negative for chest pain and PND.  Gastrointestinal: Positive for blood in stool (she has seen blood in her stools on occasion. has had colonoscopy in the past. She sees it 2-3 times per month. Denies h/o hemorrhoids. ).  Endocrine: Negative.   Genitourinary: Negative.   Musculoskeletal: Negative.  Negative for neck pain.  Allergic/Immunologic: Negative.   Neurological: Negative.  Negative for headaches.  Hematological: Negative.   Psychiatric/Behavioral: Negative.      Today's Vitals   11/22/17 0913  BP: (!) 150/88  Pulse: 86  Temp: 97.7 F (36.5 C)  TempSrc: Oral  Weight: 199 lb 6.4 oz (90.4 kg)  Height: 5' 3.75" (1.619 m)   Body mass index is 34.5 kg/m.   Objective:  Physical Exam  Constitutional: She is oriented to person, place, and time. She appears well-developed and well-nourished.  HENT:  Head: Normocephalic and atraumatic.  Right Ear: External ear normal.  Left Ear: External ear normal.  Nose: Nose normal.  Mouth/Throat: Oropharynx is clear and moist.  Eyes: Pupils are equal, round, and reactive to light. Conjunctivae and EOM are normal.  Neck: Normal range of motion. Neck supple.  Cardiovascular: Normal rate,  regular rhythm, normal heart sounds and intact distal pulses.  Pulmonary/Chest: Effort normal and breath sounds normal. Right breast exhibits no inverted nipple, no mass, no nipple discharge, no skin change and no tenderness. Left breast exhibits no inverted nipple, no mass, no nipple discharge, no skin change and no tenderness.  Abdominal: Soft. Bowel sounds are normal.  Genitourinary: Vagina normal. Rectal exam shows guaiac negative stool. Pelvic exam was performed with patient supine. There is no rash, tenderness, lesion or injury on the right labia. There is no rash, tenderness, lesion or injury on the left labia. No bleeding in the vagina. No vaginal discharge found.  Genitourinary Comments: Absent cervix, surgically absent uterus  Musculoskeletal: Normal range of motion.  Neurological: She is alert and oriented to person, place, and time. She displays no atrophy (ref hung). She exhibits normal muscle tone. Seizure activity: ref gi hung.  Skin: Skin is warm and dry.  Psychiatric: She has a normal mood and affect.  Nursing note and vitals reviewed.       Assessment And Plan:     1. Routine general medical examination at health care facility  A full exam was performed. Stool heme negative.  Importance of monthly self breast exams was discussed with the patient.  PATIENT HAS BEEN ADVISED TO GET 30-45 MINUTES REGULAR EXERCISE NO LESS THAN FOUR TO FIVE DAYS PER WEEK - BOTH WEIGHTBEARING EXERCISES AND AEROBIC ARE RECOMMENDED.  SHE IS ADVISED TO FOLLOW A HEALTHY DIET WITH AT LEAST SIX FRUITS/VEGGIES PER DAY, DECREASE INTAKE OF RED MEAT, AND TO INCREASE FISH INTAKE TO TWO DAYS PER WEEK.  MEATS/FISH SHOULD NOT BE FRIED, BAKED OR BROILED IS PREFERABLE.  I SUGGEST WEARING SPF 50 SUNSCREEN ON EXPOSED PARTS AND ESPECIALLY WHEN IN THE DIRECT SUNLIGHT FOR AN EXTENDED PERIOD OF TIME.  PLEASE AVOID FAST FOOD RESTAURANTS AND INCREASE YOUR WATER INTAKE.  - CMP14+EGFR - CBC - Lipid panel - Hemoglobin A1c -  HIV antibody (with reflex) - MM Digital Screening; Future  2. Encntr for gyn exam (general) (routine) w/o abn findings  Pap smear performed. Stool heme negative.   - Cytology -Pap Smear  3. Essential hypertension, benign  Uncontrolled. She is encouraged to take meds daily. She is also encouraged to avoid adding salt to her foods and to incorporate more exercise into her daily routine. She will rto in 3 months for re-evaluation.   - EKG 12-Lead - POCT Urinalysis Dipstick (81002) - POCT UA - Microalbumin  4. Blurred vision, bilateral  I will refer her to ophthalmology for further evaluation. She is also encouraged to try reading glasses. We also discussed need to screen for glaucoma given her race and htn.   5. Blood in stool  Stool is guaiac negative today. I will refer her to GI for possible CRC screening since she is having repeat episodes. Of note, she also has h/o colon polyps.   6. Breast cancer screening  - MM Digital Screening; Future        [name], MD

## 2017-11-26 LAB — CYTOLOGY - PAP: Diagnosis: NEGATIVE

## 2017-11-26 NOTE — Progress Notes (Signed)
Pap smear is normal

## 2018-01-08 ENCOUNTER — Ambulatory Visit
Admission: RE | Admit: 2018-01-08 | Discharge: 2018-01-08 | Disposition: A | Discharge status: Home / Self Care | Payer: 59 | Source: Ambulatory Visit | Attending: Internal Medicine | Admitting: Internal Medicine

## 2018-01-08 ENCOUNTER — Ambulatory Visit: Payer: 59

## 2018-01-08 DIAGNOSIS — Z0000 Encounter for general adult medical examination without abnormal findings: Secondary | ICD-10-CM

## 2018-01-08 DIAGNOSIS — Z1239 Encounter for other screening for malignant neoplasm of breast: Secondary | ICD-10-CM

## 2018-01-08 DIAGNOSIS — Z1231 Encounter for screening mammogram for malignant neoplasm of breast: Secondary | ICD-10-CM | POA: Diagnosis not present

## 2018-01-09 DIAGNOSIS — Z1211 Encounter for screening for malignant neoplasm of colon: Secondary | ICD-10-CM | POA: Diagnosis not present

## 2018-01-17 DIAGNOSIS — K635 Polyp of colon: Secondary | ICD-10-CM | POA: Diagnosis not present

## 2018-01-17 DIAGNOSIS — D122 Benign neoplasm of ascending colon: Secondary | ICD-10-CM | POA: Diagnosis not present

## 2018-01-17 DIAGNOSIS — Z1211 Encounter for screening for malignant neoplasm of colon: Secondary | ICD-10-CM | POA: Diagnosis not present

## 2018-01-17 DIAGNOSIS — D120 Benign neoplasm of cecum: Secondary | ICD-10-CM | POA: Diagnosis not present

## 2018-02-18 ENCOUNTER — Telehealth: Payer: Self-pay

## 2018-02-18 NOTE — Telephone Encounter (Signed)
Left message for pt to call back to move appt closer due to issues

## 2018-02-19 ENCOUNTER — Encounter: Payer: Self-pay | Admitting: Internal Medicine

## 2018-02-19 ENCOUNTER — Ambulatory Visit: Payer: 59 | Admitting: Internal Medicine

## 2018-02-19 VITALS — BP 136/94 | HR 83 | Temp 98.3°F | Ht 63.75 in | Wt 190.0 lb

## 2018-02-19 DIAGNOSIS — I10 Essential (primary) hypertension: Secondary | ICD-10-CM | POA: Diagnosis not present

## 2018-02-19 DIAGNOSIS — Z6832 Body mass index (BMI) 32.0-32.9, adult: Secondary | ICD-10-CM | POA: Diagnosis not present

## 2018-02-19 DIAGNOSIS — N3000 Acute cystitis without hematuria: Secondary | ICD-10-CM

## 2018-02-19 DIAGNOSIS — E6609 Other obesity due to excess calories: Secondary | ICD-10-CM

## 2018-02-19 LAB — POCT URINALYSIS DIPSTICK
Bilirubin, UA: NEGATIVE
GLUCOSE UA: NEGATIVE
Ketones, UA: NEGATIVE
NITRITE UA: NEGATIVE
PROTEIN UA: NEGATIVE
SPEC GRAV UA: 1.025 (ref 1.010–1.025)
Urobilinogen, UA: 0.2 E.U./dL
pH, UA: 5.5 (ref 5.0–8.0)

## 2018-02-19 MED ORDER — NITROFURANTOIN MONOHYD MACRO 100 MG PO CAPS: 100.0000 mg | ORAL_CAPSULE | Freq: Two times a day (BID) | ORAL | 0 refills | Status: AC

## 2018-02-19 NOTE — Progress Notes (Signed)
Subjective:     Patient ID: Carolyn Baker , female    DOB: [birth year] , 63 y.o.   MRN: [id number]   Chief Complaint  Patient presents with  . Urinary Tract Infection    HPI  Urinary Tract Infection   This is a new problem. The current episode started in the past 7 days. The problem occurs intermittently. The problem has been waxing and waning. The quality of the pain is described as aching. The pain is at a severity of 5/10. She is sexually active (she has a new partner).     Past Medical History:  Diagnosis Date  . Hypertension      Family History  Problem Relation Age of Onset  . Stroke Mother   . Dementia Mother   . Prostate cancer Father   . Breast cancer Neg Hx      Current Outpatient Medications:  .  amLODipine-benazepril (LOTREL) 5-40 MG capsule, Take 1 capsule by mouth daily., Disp: 90 capsule, Rfl: 1 .  Cholecalciferol (VITAMIN D3) 125 MCG (5000 UT) CAPS, Take by mouth., Disp: , Rfl:  .  hydrochlorothiazide (MICROZIDE) 12.5 MG capsule, Take 1 capsule (12.5 mg total) by mouth daily. (Patient not taking: Reported on 02/19/2018), Disp: 90 capsule, Rfl: 1 .  nitrofurantoin, macrocrystal-monohydrate, (MACROBID) 100 MG capsule, Take 1 capsule (100 mg total) by mouth 2 (two) times daily for 7 days., Disp: 14 capsule, Rfl: 0   No Known Allergies   Review of Systems  Constitutional: Negative.   Respiratory: Negative.   Cardiovascular: Negative.   Gastrointestinal: Negative.   Genitourinary: Positive for pelvic pain.  Neurological: Negative.   Psychiatric/Behavioral: Negative.      Today's Vitals   02/19/18 1159  BP: (!) 136/94  Pulse: 83  Temp: 98.3 F (36.8 C)  TempSrc: Oral  Weight: 190 lb (86.2 kg)  Height: 5' 3.75" (1.619 m)   Body mass index is 32.87 kg/m.   Objective:  Physical Exam Vitals signs and nursing note reviewed.  Constitutional:      Appearance: Normal appearance.  HENT:     Head: Normocephalic and atraumatic.  Cardiovascular:    Rate and Rhythm: Normal rate and regular rhythm.     Heart sounds: Normal heart sounds.  Pulmonary:     Effort: Pulmonary effort is normal.     Breath sounds: Normal breath sounds.  Genitourinary:    Comments: No pelvic tenderness, no suprapubic tenderness Neurological:     General: No focal deficit present.     Mental Status: She is alert.  Psychiatric:        Mood and Affect: Mood normal.         Assessment And Plan:     1. Acute cystitis without hematuria  Rx macrobid was sent to the pharmacy. She is encouraged to complete full abx course. Safe sexual practices were discussed with the patient. She is encouraged to urinate after sexual intercourse.   - Culture, Urine - POCT Urinalysis Dipstick (81002) - Chlamydia/Gonococcus/Trichomonas, NAA  2. Essential hypertension, benign  Fair control. She will continue with current meds for now. Pt advised bp elevation could be due to her discomfort. She will rto in six months for a full physical exam.   3. Class 1 obesity due to excess calories without serious comorbidity with body mass index (BMI) of 32.0 to 32.9 in adult  She was congratulated on her 9 pound weight loss.  She is encouraged to strive for BMI less than 30 to decrease cardiac  risk.  [name], MD

## 2018-02-21 ENCOUNTER — Ambulatory Visit: Payer: 59 | Admitting: Internal Medicine

## 2018-02-21 LAB — CHLAMYDIA/GONOCOCCUS/TRICHOMONAS, NAA
CHLAMYDIA BY NAA: NEGATIVE
Gonococcus by NAA: NEGATIVE
Trich vag by NAA: NEGATIVE

## 2018-02-21 LAB — URINE CULTURE

## 2018-03-09 ENCOUNTER — Encounter: Payer: Self-pay | Admitting: Internal Medicine

## 2018-05-07 ENCOUNTER — Other Ambulatory Visit: Payer: Self-pay | Admitting: Internal Medicine

## 2018-05-14 ENCOUNTER — Other Ambulatory Visit: Payer: Self-pay

## 2018-05-14 MED ORDER — AMLODIPINE BESY-BENAZEPRIL HCL 5-40 MG PO CAPS: 1.0000 | ORAL_CAPSULE | Freq: Every day | ORAL | 1 refills | Status: DC

## 2018-05-15 ENCOUNTER — Other Ambulatory Visit: Payer: Self-pay

## 2018-05-15 MED ORDER — AMLODIPINE BESY-BENAZEPRIL HCL 5-40 MG PO CAPS: 1.0000 | ORAL_CAPSULE | Freq: Every day | ORAL | 1 refills | Status: DC

## 2018-05-15 MED ORDER — HYDROCHLOROTHIAZIDE 12.5 MG PO CAPS: ORAL_CAPSULE | ORAL | 1 refills | Status: DC

## 2018-05-22 ENCOUNTER — Encounter: Payer: Self-pay | Admitting: Internal Medicine

## 2018-05-22 ENCOUNTER — Ambulatory Visit: Payer: 59 | Admitting: Internal Medicine

## 2018-05-22 ENCOUNTER — Other Ambulatory Visit: Payer: Self-pay

## 2018-05-22 VITALS — BP 134/88 | HR 80 | Temp 97.7°F | Ht 63.75 in | Wt 193.6 lb

## 2018-05-22 DIAGNOSIS — Z6833 Body mass index (BMI) 33.0-33.9, adult: Secondary | ICD-10-CM

## 2018-05-22 DIAGNOSIS — I10 Essential (primary) hypertension: Secondary | ICD-10-CM

## 2018-05-22 DIAGNOSIS — E6609 Other obesity due to excess calories: Secondary | ICD-10-CM | POA: Diagnosis not present

## 2018-05-22 MED ORDER — HYDROCHLOROTHIAZIDE 12.5 MG PO CAPS: ORAL_CAPSULE | ORAL | 1 refills | Status: DC

## 2018-05-22 NOTE — Patient Instructions (Signed)
Dr. Katy Fitch, eye doctor [phone]

## 2018-05-25 NOTE — Progress Notes (Signed)
Subjective:     Patient ID: Carolyn Baker , female    DOB: 03/04/1955 , 63 y.o.   MRN: [id number]   Chief Complaint  Patient presents with  . Hypertension    HPI  She is here today for f/u bp. She feels fine on her current regimen. Admits she is probably not exercising as much as she should, but she has started to move more. She has no new concerns or complaints at this time.   Hypertension  This is a chronic problem. The current episode started more than 1 year ago. The problem has been gradually improving since onset. The problem is controlled. Pertinent negatives include no blurred vision, chest pain, palpitations or shortness of breath. The current treatment provides moderate improvement. Compliance problems include exercise.      Past Medical History:  Diagnosis Date  . Hypertension      Family History  Problem Relation Age of Onset  . Stroke Mother   . Dementia Mother   . Prostate cancer Father   . Breast cancer Neg Hx      Current Outpatient Medications:  .  amLODipine-benazepril (LOTREL) 5-40 MG capsule, Take 1 capsule by mouth daily., Disp: 90 capsule, Rfl: 1 .  Cholecalciferol (VITAMIN D3) 125 MCG (5000 UT) CAPS, Take by mouth., Disp: , Rfl:  .  hydrochlorothiazide (MICROZIDE) 12.5 MG capsule, TAKE 1 CAPSULE BY MOUTH EVERY DAY, Disp: 90 capsule, Rfl: 1   No Known Allergies   Review of Systems  Constitutional: Negative.   Eyes: Negative for blurred vision.  Respiratory: Negative.  Negative for shortness of breath.   Cardiovascular: Negative.  Negative for chest pain and palpitations.  Gastrointestinal: Negative.   Neurological: Negative.   Psychiatric/Behavioral: Negative.      Today's Vitals   05/22/18 1109  BP: 134/88  Pulse: 80  Temp: 97.7 F (36.5 C)  TempSrc: Oral  Weight: 193 lb 9.6 oz (87.8 kg)  Height: 5' 3.75" (1.619 m)  PainSc: 0-No pain   Body mass index is 33.49 kg/m.   Objective:  Physical Exam Vitals signs and nursing note  reviewed.  Constitutional:      Appearance: Normal appearance.  HENT:     Head: Normocephalic and atraumatic.  Cardiovascular:     Rate and Rhythm: Normal rate and regular rhythm.     Heart sounds: Normal heart sounds.  Pulmonary:     Effort: Pulmonary effort is normal.     Breath sounds: Normal breath sounds.  Skin:    General: Skin is warm.  Neurological:     General: No focal deficit present.     Mental Status: She is alert.  Psychiatric:        Mood and Affect: Mood normal.        Behavior: Behavior normal.         Assessment And Plan:     1. Essential hypertension, benign  Fair control. She will continue with current meds. She is encouraged to avoid adding salt to her foods. She is also encouraged to aim for 150 minutes of exercise per week.   - BMP8+EGFR; Future  2. Class 1 obesity due to excess calories without serious comorbidity with body mass index (BMI) of 33.0 to 33.9 in adult  Importance of achieving optimal weight to decrease risk of cardiovascular disease and cancers was discussed with the patient in full detail. She is encouraged to start slowly - start with 10 minutes twice daily at least three to four days per  week and to gradually build to 30 minutes five days weekly. She was given tips to incorporate more activity into her daily routine - take stairs when possible, park farther away from grocery stores, etc.    [name], MD    THE PATIENT IS ENCOURAGED TO PRACTICE SOCIAL DISTANCING DUE TO THE COVID-19 PANDEMIC.

## 2018-06-19 ENCOUNTER — Ambulatory Visit: Payer: 59

## 2018-06-19 ENCOUNTER — Other Ambulatory Visit: Payer: Self-pay

## 2018-06-19 VITALS — BP 136/84 | HR 87 | Temp 98.2°F | Ht 64.4 in | Wt 194.6 lb

## 2018-06-19 DIAGNOSIS — I10 Essential (primary) hypertension: Secondary | ICD-10-CM

## 2018-06-19 NOTE — Progress Notes (Signed)
Patient came today for a blood pressure check. Encouraged to call the office for any concerns.

## 2018-06-20 LAB — BMP8+EGFR
BUN/Creatinine Ratio: 21 (ref 12–28)
BUN: 22 mg/dL (ref 8–27)
CO2: 22 mmol/L (ref 20–29)
Calcium: 9.4 mg/dL (ref 8.7–10.3)
Chloride: 104 mmol/L (ref 96–106)
Creatinine, Ser: 1.07 mg/dL — ABNORMAL HIGH (ref 0.57–1.00)
GFR calc Af Amer: 64 mL/min/{1.73_m2} (ref >59)
GFR calc non Af Amer: 56 mL/min/{1.73_m2} — ABNORMAL LOW (ref >59)
Glucose: 106 mg/dL — ABNORMAL HIGH (ref 65–99)
Potassium: 4 mmol/L (ref 3.5–5.2)
Sodium: 142 mmol/L (ref 134–144)

## 2018-08-13 ENCOUNTER — Ambulatory Visit: Payer: 59 | Admitting: Nurse Practitioner

## 2018-08-13 ENCOUNTER — Encounter: Payer: Self-pay | Admitting: Nurse Practitioner

## 2018-08-13 ENCOUNTER — Other Ambulatory Visit: Payer: Self-pay

## 2018-08-13 VITALS — BP 150/98 | HR 82 | Temp 97.6°F | Ht 64.0 in | Wt 192.6 lb

## 2018-08-13 DIAGNOSIS — Z87440 Personal history of urinary (tract) infections: Secondary | ICD-10-CM

## 2018-08-13 DIAGNOSIS — R300 Dysuria: Secondary | ICD-10-CM | POA: Diagnosis not present

## 2018-08-13 DIAGNOSIS — I10 Essential (primary) hypertension: Secondary | ICD-10-CM

## 2018-08-13 LAB — POCT URINALYSIS DIPSTICK
Bilirubin, UA: NEGATIVE
Glucose, UA: NEGATIVE
Ketones, UA: NEGATIVE
Nitrite, UA: NEGATIVE
Protein, UA: POSITIVE — AB
Spec Grav, UA: 1.025 (ref 1.010–1.025)
Urobilinogen, UA: 0.2 E.U./dL
pH, UA: 6 (ref 5.0–8.0)

## 2018-08-13 MED ORDER — NITROFURANTOIN MONOHYD MACRO 100 MG PO CAPS: 100.0000 mg | ORAL_CAPSULE | Freq: Two times a day (BID) | ORAL | 0 refills | Status: AC

## 2018-08-13 MED ORDER — AMLODIPINE BESY-BENAZEPRIL HCL 5-40 MG PO CAPS: 1.0000 | ORAL_CAPSULE | Freq: Every day | ORAL | 0 refills | Status: DC

## 2018-08-13 NOTE — Progress Notes (Signed)
  Subjective:     Patient ID: Carolyn Baker , female    DOB: 10-Mar-1955 , 63 y.o.   MRN: [id number]   Chief Complaint  Patient presents with  . Dysuria    pt states has abdomin pain urinary frequency burning sensation    HPI  Pain to bottom of stomach has been worsening in the last few days.   Dysuria  This is a new problem. The problem occurs every urination. The problem has been unchanged. She is not sexually active. Associated symptoms include frequency. Pertinent negatives include no chills. She has tried nothing for the symptoms. Her past medical history is significant for recurrent UTIs.     Past Medical History:  Diagnosis Date  . Hypertension      Family History  Problem Relation Age of Onset  . Stroke Mother   . Dementia Mother   . Prostate cancer Father   . Breast cancer Neg Hx      Current Outpatient Medications:  .  Cholecalciferol (VITAMIN D3) 125 MCG (5000 UT) CAPS, Take by mouth., Disp: , Rfl:  .  hydrochlorothiazide (MICROZIDE) 12.5 MG capsule, TAKE 1 CAPSULE BY MOUTH EVERY DAY, Disp: 90 capsule, Rfl: 1 .  amLODipine-benazepril (LOTREL) 5-40 MG capsule, Take 1 capsule by mouth daily., Disp: 90 capsule, Rfl: 0   No Known Allergies   Review of Systems  Constitutional: Negative for chills and fatigue.  Respiratory: Negative.   Cardiovascular: Negative.  Negative for chest pain, palpitations and leg swelling.  Endocrine: Negative for polydipsia, polyphagia and polyuria.  Genitourinary: Positive for dysuria and frequency.     Today's Vitals   08/13/18 1231  BP: (!) 150/98  Pulse: 82  Temp: 97.6 F (36.4 C)  TempSrc: Oral  Weight: 192 lb 9.6 oz (87.4 kg)  Height: 5\' 4"  (1.626 m)  PainSc: 7   PainLoc: Abdomen   Body mass index is 33.06 kg/m.   Objective:  Physical Exam Constitutional:      Appearance: Normal appearance.  Cardiovascular:     Rate and Rhythm: Normal rate and regular rhythm.     Pulses: Normal pulses.     Heart sounds:  Normal heart sounds. No murmur.  Pulmonary:     Effort: Pulmonary effort is normal. No respiratory distress.     Breath sounds: Normal breath sounds.  Skin:    General: Skin is warm and dry.     Capillary Refill: Capillary refill takes less than 2 seconds.  Neurological:     General: No focal deficit present.     Mental Status: She is alert and oriented to person, place, and time.         Assessment And Plan:    1. Dysuria  Positive protein and small leukocytes   Will treat empirically with macrobid and send urine for culture  Increase water intake particularly lemon water and cranberry juice. - POCT Urinalysis Dipstick (81002) - Culture, Urine - nitrofurantoin, macrocrystal-monohydrate, (MACROBID) 100 MG capsule; Take 1 capsule (100 mg total) by mouth 2 (two) times daily for 5 days.  Dispense: 10 capsule; Refill: 0  2. History of urinary tract infection  - Culture, Urine - nitrofurantoin, macrocrystal-monohydrate, (MACROBID) 100 MG capsule; Take 1 capsule (100 mg total) by mouth 2 (two) times daily for 5 days.  Dispense: 10 capsule; Refill: 0   Minette Brine, FNP    THE PATIENT IS ENCOURAGED TO PRACTICE SOCIAL DISTANCING DUE TO THE COVID-19 PANDEMIC.

## 2018-08-14 LAB — URINE CULTURE

## 2018-08-15 ENCOUNTER — Ambulatory Visit: Payer: Self-pay | Admitting: Nurse Practitioner

## 2018-11-26 ENCOUNTER — Other Ambulatory Visit: Payer: Self-pay

## 2018-11-26 ENCOUNTER — Encounter: Payer: Self-pay | Admitting: Internal Medicine

## 2018-11-26 ENCOUNTER — Ambulatory Visit: Payer: 59 | Admitting: Internal Medicine

## 2018-11-26 VITALS — BP 144/96 | HR 83 | Temp 98.1°F | Ht 63.8 in | Wt 198.8 lb

## 2018-11-26 DIAGNOSIS — E6609 Other obesity due to excess calories: Secondary | ICD-10-CM

## 2018-11-26 DIAGNOSIS — Z6834 Body mass index (BMI) 34.0-34.9, adult: Secondary | ICD-10-CM

## 2018-11-26 DIAGNOSIS — I10 Essential (primary) hypertension: Secondary | ICD-10-CM

## 2018-11-26 DIAGNOSIS — Z0000 Encounter for general adult medical examination without abnormal findings: Secondary | ICD-10-CM | POA: Diagnosis not present

## 2018-11-26 LAB — POCT URINALYSIS DIPSTICK
Bilirubin, UA: NEGATIVE
Blood, UA: NEGATIVE
Glucose, UA: NEGATIVE
Ketones, UA: NEGATIVE
Nitrite, UA: NEGATIVE
Protein, UA: NEGATIVE
Spec Grav, UA: 1.02 (ref 1.010–1.025)
Urobilinogen, UA: 0.2 E.U./dL
pH, UA: 6 (ref 5.0–8.0)

## 2018-11-26 LAB — POCT UA - MICROALBUMIN
Albumin/Creatinine Ratio, Urine, POC: 30
Creatinine, POC: 300 mg/dL
Microalbumin Ur, POC: 10 mg/L

## 2018-11-26 MED ORDER — HYDROCHLOROTHIAZIDE 12.5 MG PO CAPS: ORAL_CAPSULE | ORAL | 1 refills | Status: DC

## 2018-11-26 MED ORDER — AMLODIPINE BESY-BENAZEPRIL HCL 5-40 MG PO CAPS: 1.0000 | ORAL_CAPSULE | Freq: Every day | ORAL | 0 refills | Status: DC

## 2018-11-26 NOTE — Progress Notes (Signed)
Subjective:     Patient ID: Carolyn Baker , female    DOB: [birth year] , 63 y.o.   MRN: [id number]   Chief Complaint  Patient presents with  . Annual Exam  . Hypertension    HPI  She is here today for a full physical examination.  She is no longer followed by GYN.  She is s/p hysterectomy.   Hypertension This is a chronic problem. The current episode started more than 1 year ago. The problem has been gradually improving since onset. The problem is controlled. Pertinent negatives include no blurred vision, chest pain, palpitations or shortness of breath. The current treatment provides moderate improvement. Compliance problems include exercise.      Past Medical History:  Diagnosis Date  . Hypertension      Family History  Problem Relation Age of Onset  . Stroke Mother   . Dementia Mother   . Prostate cancer Father   . Breast cancer Neg Hx      Current Outpatient Medications:  .  Cholecalciferol (VITAMIN D3) 125 MCG (5000 UT) CAPS, Take by mouth., Disp: , Rfl:  .  amLODipine-benazepril (LOTREL) 5-40 MG capsule, Take 1 capsule by mouth daily., Disp: 90 capsule, Rfl: 0 .  hydrochlorothiazide (MICROZIDE) 12.5 MG capsule, TAKE 1 CAPSULE BY MOUTH EVERY DAY, Disp: 90 capsule, Rfl: 1   No Known Allergies    The patient states she uses status post hysterectomy for birth control. Last LMP was No LMP recorded. Patient has had a hysterectomy.. Negative for Dysmenorrhea Negative for: breast discharge, breast lump(s), breast pain and breast self exam. Associated symptoms include abnormal vaginal bleeding. Pertinent negatives include abnormal bleeding (hematology), anxiety, decreased libido, depression, difficulty falling sleep, dyspareunia, history of infertility, nocturia, sexual dysfunction, sleep disturbances, urinary incontinence, urinary urgency, vaginal discharge and vaginal itching. Diet regular.The patient states her exercise level is  intermittent.  . The patient's tobacco use  is:  Social History   Tobacco Use  Smoking Status Never Smoker  Smokeless Tobacco Never Used  . She has been exposed to passive smoke. The patient's alcohol use is:  Social History   Substance and Sexual Activity  Alcohol Use Yes   Comment: rarely   Review of Systems  Constitutional: Negative.   HENT: Negative.   Eyes: Negative.  Negative for blurred vision.  Respiratory: Negative.  Negative for shortness of breath.   Cardiovascular: Negative.  Negative for chest pain and palpitations.  Endocrine: Negative.   Genitourinary: Negative.   Musculoskeletal: Negative.   Skin: Negative.   Allergic/Immunologic: Negative.   Neurological: Negative.   Hematological: Negative.   Psychiatric/Behavioral: Negative.      Today's Vitals   11/26/18 0957  BP: (!) 144/96  Pulse: 83  Temp: 98.1 F (36.7 C)  TempSrc: Oral  Weight: 198 lb 12.8 oz (90.2 kg)  Height: 5' 3.8" (1.621 m)   Body mass index is 34.34 kg/m.   Objective:  Physical Exam Vitals signs and nursing note reviewed.  Constitutional:      Appearance: Normal appearance. She is obese.  HENT:     Head: Normocephalic and atraumatic.     Right Ear: Tympanic membrane, ear canal and external ear normal.     Left Ear: Tympanic membrane, ear canal and external ear normal.     Nose:     Comments: Deferred, masked    Mouth/Throat:     Comments: Deferred, masked Eyes:     Extraocular Movements: Extraocular movements intact.     Conjunctiva/sclera:  Conjunctivae normal.     Pupils: Pupils are equal, round, and reactive to light.  Neck:     Musculoskeletal: Normal range of motion and neck supple.  Cardiovascular:     Rate and Rhythm: Normal rate and regular rhythm.     Pulses: Normal pulses.     Heart sounds: Normal heart sounds.  Pulmonary:     Effort: Pulmonary effort is normal.     Breath sounds: Normal breath sounds.  Chest:     Breasts: Tanner Score is 5.        Right: Normal.        Left: Normal.  Abdominal:      General: Bowel sounds are normal.     Palpations: Abdomen is soft.  Genitourinary:    Comments: deferred Musculoskeletal: Normal range of motion.  Skin:    General: Skin is warm and dry.  Neurological:     General: No focal deficit present.     Mental Status: She is alert and oriented to person, place, and time.  Psychiatric:        Mood and Affect: Mood normal.        Behavior: Behavior normal.         Assessment And Plan:     1. Routine general medical examination at health care facility  A full exam was performed.  Importance of monthly self breast exams was discussed with the patient. PATIENT HAS BEEN ADVISED TO GET 30-45 MINUTES REGULAR EXERCISE NO LESS THAN FOUR TO FIVE DAYS PER WEEK - BOTH WEIGHTBEARING EXERCISES AND AEROBIC ARE RECOMMENDED.  SHE WAS ADVISED TO FOLLOW A HEALTHY DIET WITH AT LEAST SIX FRUITS/VEGGIES PER DAY, DECREASE INTAKE OF RED MEAT, AND TO INCREASE FISH INTAKE TO TWO DAYS PER WEEK.  MEATS/FISH SHOULD NOT BE FRIED, BAKED OR BROILED IS PREFERABLE.  I SUGGEST WEARING SPF 50 SUNSCREEN ON EXPOSED PARTS AND ESPECIALLY WHEN IN THE DIRECT SUNLIGHT FOR AN EXTENDED PERIOD OF TIME.  PLEASE AVOID FAST FOOD RESTAURANTS AND INCREASE YOUR WATER INTAKE.  - CMP14+EGFR - CBC - Lipid panel - Hemoglobin A1c - TSH  2. Essential hypertension, benign  Chronic, fair control. She will continue with current meds. I will also add hctz 12.53m once daily to her current regimen.  She will rto in four weeks for re-evaluation.  She is encouraged to avoid adding salt to her foods. EKG performed, no new changes noted.   - EKG 12-Lead - POCT Urinalysis Dipstick (81002) - POCT UA - Microalbumin   3. Class 1 obesity due to excess calories without serious comorbidity with body mass index (BMI) of 34.0 to 34.9 in adult  Importance of achieving optimal weight to decrease risk of cardiovascular disease and cancers was discussed with the patient in full detail. She is encouraged to start  slowly - start with 10 minutes twice daily at least three to four days per week and to gradually build to 30 minutes five days weekly. She was given tips to incorporate more activity into her daily routine - take stairs when possible, park farther away from grocery stores, etc.    RMaximino Greenland MD    THE PATIENT IS ENCOURAGED TO PRACTICE SOCIAL DISTANCING DUE TO THE COVID-19 PANDEMIC.

## 2018-11-26 NOTE — Patient Instructions (Addendum)
Start taking HCTZ 12.5mg   - two tabs daily  Health Maintenance, Female Adopting a healthy lifestyle and getting preventive care are important in promoting health and wellness. Ask your health care provider about:  The right schedule for you to have regular tests and exams.  Things you can do on your own to prevent diseases and keep yourself healthy. What should I know about diet, weight, and exercise? Eat a healthy diet   Eat a diet that includes plenty of vegetables, fruits, low-fat dairy products, and lean protein.  Do not eat a lot of foods that are high in solid fats, added sugars, or sodium. Maintain a healthy weight Body mass index (BMI) is used to identify weight problems. It estimates body fat based on height and weight. Your health care provider can help determine your BMI and help you achieve or maintain a healthy weight. Get regular exercise Get regular exercise. This is one of the most important things you can do for your health. Most adults should:  Exercise for at least 150 minutes each week. The exercise should increase your heart rate and make you sweat (moderate-intensity exercise).  Do strengthening exercises at least twice a week. This is in addition to the moderate-intensity exercise.  Spend less time sitting. Even light physical activity can be beneficial. Watch cholesterol and blood lipids Have your blood tested for lipids and cholesterol at 62 years of age, then have this test every 5 years. Have your cholesterol levels checked more often if:  Your lipid or cholesterol levels are high.  You are older than 62 years of age.  You are at high risk for heart disease. What should I know about cancer screening? Depending on your health history and family history, you may need to have cancer screening at various ages. This may include screening for:  Breast cancer.  Cervical cancer.  Colorectal cancer.  Skin cancer.  Lung cancer. What should I know about  heart disease, diabetes, and high blood pressure? Blood pressure and heart disease  High blood pressure causes heart disease and increases the risk of stroke. This is more likely to develop in people who have high blood pressure readings, are of African descent, or are overweight.  Have your blood pressure checked: ? Every 3-5 years if you are 43-65 years of age. ? Every year if you are 21 years old or older. Diabetes Have regular diabetes screenings. This checks your fasting blood sugar level. Have the screening done:  Once every three years after age 56 if you are at a normal weight and have a low risk for diabetes.  More often and at a younger age if you are overweight or have a high risk for diabetes. What should I know about preventing infection? Hepatitis B If you have a higher risk for hepatitis B, you should be screened for this virus. Talk with your health care provider to find out if you are at risk for hepatitis B infection. Hepatitis C Testing is recommended for:  Everyone born from [birth year] through 1964.  Anyone with known risk factors for hepatitis C. Sexually transmitted infections (STIs)  Get screened for STIs, including gonorrhea and chlamydia, if: ? You are sexually active and are younger than 62 years of age. ? You are older than 61 years of age and your health care provider tells you that you are at risk for this type of infection. ? Your sexual activity has changed since you were last screened, and you are at increased risk  for chlamydia or gonorrhea. Ask your health care provider if you are at risk.  Ask your health care provider about whether you are at high risk for HIV. Your health care provider may recommend a prescription medicine to help prevent HIV infection. If you choose to take medicine to prevent HIV, you should first get tested for HIV. You should then be tested every 3 months for as long as you are taking the medicine. Pregnancy  If you are about to  stop having your period (premenopausal) and you may become pregnant, seek counseling before you get pregnant.  Take 400 to 800 micrograms (mcg) of folic acid every day if you become pregnant.  Ask for birth control (contraception) if you want to prevent pregnancy. Osteoporosis and menopause Osteoporosis is a disease in which the bones lose minerals and strength with aging. This can result in bone fractures. If you are 52 years old or older, or if you are at risk for osteoporosis and fractures, ask your health care provider if you should:  Be screened for bone loss.  Take a calcium or vitamin D supplement to lower your risk of fractures.  Be given hormone replacement therapy (HRT) to treat symptoms of menopause. Follow these instructions at home: Lifestyle  Do not use any products that contain nicotine or tobacco, such as cigarettes, e-cigarettes, and chewing tobacco. If you need help quitting, ask your health care provider.  Do not use street drugs.  Do not share needles.  Ask your health care provider for help if you need support or information about quitting drugs. Alcohol use  Do not drink alcohol if: ? Your health care provider tells you not to drink. ? You are pregnant, may be pregnant, or are planning to become pregnant.  If you drink alcohol: ? Limit how much you use to 0-1 drink a day. ? Limit intake if you are breastfeeding.  Be aware of how much alcohol is in your drink. In the [place] one drink equals one 12 oz bottle of beer (355 mL), one 5 oz glass of wine (148 mL), or one 1 oz glass of hard liquor (44 mL). General instructions  Schedule regular health, dental, and eye exams.  Stay current with your vaccines.  Tell your health care provider if: ? You often feel depressed. ? You have ever been abused or do not feel safe at home. Summary  Adopting a healthy lifestyle and getting preventive care are important in promoting health and wellness.  Follow your  health care provider's instructions about healthy diet, exercising, and getting tested or screened for diseases.  Follow your health care provider's instructions on monitoring your cholesterol and blood pressure. This information is not intended to replace advice given to you by your health care provider. Make sure you discuss any questions you have with your health care provider. Document Released: 07/04/2010 Document Revised: 12/12/2017 Document Reviewed: 12/12/2017 Elsevier Patient Education  2020 [employer].

## 2018-11-27 LAB — CBC
Hematocrit: 46.7 % — ABNORMAL HIGH (ref 34.0–46.6)
Hemoglobin: 15.6 g/dL (ref 11.1–15.9)
MCH: 31.5 pg (ref 26.6–33.0)
MCHC: 33.4 g/dL (ref 31.5–35.7)
MCV: 94 fL (ref 79–97)
Platelets: 179 10*3/uL (ref 150–450)
RBC: 4.95 x10E6/uL (ref 3.77–5.28)
RDW: 12.8 % (ref 11.7–15.4)
WBC: 4.1 10*3/uL (ref 3.4–10.8)

## 2018-11-27 LAB — CMP14+EGFR
ALT: 26 IU/L (ref 0–32)
AST: 21 IU/L (ref 0–40)
Albumin/Globulin Ratio: 1.2 (ref 1.2–2.2)
Albumin: 4.1 g/dL (ref 3.8–4.8)
Alkaline Phosphatase: 54 IU/L (ref 39–117)
BUN/Creatinine Ratio: 15 (ref 12–28)
BUN: 13 mg/dL (ref 8–27)
Bilirubin Total: 0.4 mg/dL (ref 0.0–1.2)
CO2: 21 mmol/L (ref 20–29)
Calcium: 9.5 mg/dL (ref 8.7–10.3)
Chloride: 104 mmol/L (ref 96–106)
Creatinine, Ser: 0.85 mg/dL (ref 0.57–1.00)
GFR calc Af Amer: 85 mL/min/{1.73_m2} (ref >59)
GFR calc non Af Amer: 74 mL/min/{1.73_m2} (ref >59)
Globulin, Total: 3.4 g/dL (ref 1.5–4.5)
Glucose: 90 mg/dL (ref 65–99)
Potassium: 4.4 mmol/L (ref 3.5–5.2)
Sodium: 141 mmol/L (ref 134–144)
Total Protein: 7.5 g/dL (ref 6.0–8.5)

## 2018-11-27 LAB — LIPID PANEL
Chol/HDL Ratio: 3.7 ratio (ref 0.0–4.4)
Cholesterol, Total: 212 mg/dL — ABNORMAL HIGH (ref 100–199)
HDL: 57 mg/dL (ref >39)
LDL Chol Calc (NIH): 136 mg/dL — ABNORMAL HIGH (ref 0–99)
Triglycerides: 108 mg/dL (ref 0–149)
VLDL Cholesterol Cal: 19 mg/dL (ref 5–40)

## 2018-11-27 LAB — HEMOGLOBIN A1C
Est. average glucose Bld gHb Est-mCnc: 117 mg/dL
Hgb A1c MFr Bld: 5.7 % — ABNORMAL HIGH (ref 4.8–5.6)

## 2018-11-27 LAB — TSH: TSH: 1.32 u[IU]/mL (ref 0.450–4.500)

## 2018-12-25 ENCOUNTER — Ambulatory Visit: Payer: 59 | Admitting: Internal Medicine

## 2018-12-25 ENCOUNTER — Other Ambulatory Visit: Payer: Self-pay

## 2018-12-25 ENCOUNTER — Encounter: Payer: Self-pay | Admitting: Internal Medicine

## 2018-12-25 VITALS — BP 122/80 | HR 101 | Temp 98.2°F | Ht 64.0 in | Wt 203.6 lb

## 2018-12-25 DIAGNOSIS — Z79899 Other long term (current) drug therapy: Secondary | ICD-10-CM

## 2018-12-25 DIAGNOSIS — Z6834 Body mass index (BMI) 34.0-34.9, adult: Secondary | ICD-10-CM | POA: Diagnosis not present

## 2018-12-25 DIAGNOSIS — I10 Essential (primary) hypertension: Secondary | ICD-10-CM | POA: Diagnosis not present

## 2018-12-25 DIAGNOSIS — E6609 Other obesity due to excess calories: Secondary | ICD-10-CM | POA: Diagnosis not present

## 2018-12-25 MED ORDER — AMLODIPINE BESY-BENAZEPRIL HCL 5-40 MG PO CAPS: 1.0000 | ORAL_CAPSULE | Freq: Every day | ORAL | 2 refills | Status: DC

## 2018-12-25 NOTE — Patient Instructions (Signed)

## 2018-12-26 LAB — BMP8+EGFR
BUN/Creatinine Ratio: 18 (ref 12–28)
BUN: 18 mg/dL (ref 8–27)
CO2: 22 mmol/L (ref 20–29)
Calcium: 9.7 mg/dL (ref 8.7–10.3)
Chloride: 101 mmol/L (ref 96–106)
Creatinine, Ser: 0.99 mg/dL (ref 0.57–1.00)
GFR calc Af Amer: 71 mL/min/{1.73_m2} (ref >59)
GFR calc non Af Amer: 61 mL/min/{1.73_m2} (ref >59)
Glucose: 102 mg/dL — ABNORMAL HIGH (ref 65–99)
Potassium: 3.9 mmol/L (ref 3.5–5.2)
Sodium: 141 mmol/L (ref 134–144)

## 2018-12-28 NOTE — Progress Notes (Signed)
This visit occurred during the SARS-CoV-2 public health emergency.  Safety protocols were in place, including screening questions prior to the visit, additional usage of staff PPE, and extensive cleaning of exam room while observing appropriate contact time as indicated for disinfecting solutions.  Subjective:     Patient ID: Carolyn Baker , female    DOB: 1955-05-24 , 63 y.o.   MRN: [id number]   Chief Complaint  Patient presents with  . Hypertension    HPI  She is here today for bp check. HCTZ was added at her last visit.   Hypertension This is a chronic problem. The current episode started more than 1 year ago. The problem has been gradually improving since onset. The problem is controlled. Pertinent negatives include no blurred vision, chest pain, palpitations or shortness of breath. The current treatment provides moderate improvement. Compliance problems include exercise.      Past Medical History:  Diagnosis Date  . Hypertension      Family History  Problem Relation Age of Onset  . Stroke Mother   . Dementia Mother   . Prostate cancer Father   . Breast cancer Neg Hx      Current Outpatient Medications:  .  amLODipine-benazepril (LOTREL) 5-40 MG capsule, Take 1 capsule by mouth daily., Disp: 90 capsule, Rfl: 2 .  Cholecalciferol (VITAMIN D3) 125 MCG (5000 UT) CAPS, Take by mouth., Disp: , Rfl:  .  hydrochlorothiazide (MICROZIDE) 12.5 MG capsule, TAKE 1 CAPSULE BY MOUTH EVERY DAY, Disp: 90 capsule, Rfl: 1   No Known Allergies   Review of Systems  Constitutional: Negative.   Eyes: Negative for blurred vision.  Respiratory: Negative.  Negative for shortness of breath.   Cardiovascular: Negative.  Negative for chest pain and palpitations.  Gastrointestinal: Negative.   Neurological: Negative.   Psychiatric/Behavioral: Negative.      Today's Vitals   12/25/18 1439  BP: 122/80  Pulse: (!) 101  Temp: 98.2 F (36.8 C)  TempSrc: Oral  Weight: 203 lb 9.6 oz  (92.4 kg)  Height: '5\' 4"'  (1.626 m)  PainSc: 0-No pain   Body mass index is 34.95 kg/m.   Objective:  Physical Exam Vitals and nursing note reviewed.  Constitutional:      Appearance: Normal appearance.  HENT:     Head: Normocephalic and atraumatic.  Cardiovascular:     Rate and Rhythm: Normal rate and regular rhythm.     Heart sounds: Normal heart sounds.  Pulmonary:     Effort: Pulmonary effort is normal.     Breath sounds: Normal breath sounds.  Skin:    General: Skin is warm.  Neurological:     General: No focal deficit present.     Mental Status: She is alert.  Psychiatric:        Mood and Affect: Mood normal.        Behavior: Behavior normal.         Assessment And Plan:     1. Essential hypertension, benign  Chronic, improved control with additional of HCTZ. She will continue with current meds.   2. Drug therapy  I will check renal function today.   - BMP8+EGFR  3. Class 1 obesity due to excess calories without serious comorbidity with body mass index (BMI) of 34.0 to 34.9 in adult  She is advised of five pound weight gain since her last visit. She is encouraged to exercise 150 minutes per week and limit her intake of processed foods.   [name],  MD    THE PATIENT IS ENCOURAGED TO PRACTICE SOCIAL DISTANCING DUE TO THE COVID-19 PANDEMIC.

## 2018-12-31 ENCOUNTER — Telehealth: Payer: Self-pay

## 2018-12-31 NOTE — Telephone Encounter (Signed)
-----   Message from Glendale Chard, MD sent at 12/30/2018  8:10 PM EST ----- Your kidney fxn is stable.

## 2018-12-31 NOTE — Telephone Encounter (Signed)
Left at detailed message at the pt's request

## 2019-04-10 ENCOUNTER — Ambulatory Visit: Payer: 59 | Attending: Internal Medicine

## 2019-04-10 DIAGNOSIS — Z23 Encounter for immunization: Secondary | ICD-10-CM

## 2019-04-10 NOTE — Progress Notes (Signed)
   Covid-19 Vaccination Clinic  Name:  Carolyn Baker    MRN: [id number] DOB: Nov 21, 1955  04/10/2019  Carolyn Baker was observed post Covid-19 immunization for 15 minutes without incident. She was provided with Vaccine Information Sheet and instruction to access the V-Safe system.   Carolyn Baker was instructed to call 911 with any severe reactions post vaccine: Marland Kitchen Difficulty breathing  . Swelling of face and throat  . A fast heartbeat  . A bad rash all over body  . Dizziness and weakness   Immunizations Administered    Name Date Dose VIS Date Route   Pfizer COVID-19 Vaccine 04/10/2019  8:23 AM 0.3 mL 12/13/2018 Intramuscular   Manufacturer: [place]   Lot: [id number]   [place]: [id number]

## 2019-05-06 ENCOUNTER — Ambulatory Visit: Payer: 59 | Attending: Internal Medicine

## 2019-05-06 DIAGNOSIS — Z23 Encounter for immunization: Secondary | ICD-10-CM

## 2019-05-06 NOTE — Progress Notes (Signed)
   Covid-19 Vaccination Clinic  Name:  Carolyn Baker    MRN: [id number] DOB: November 03, 1955  05/06/2019  [name] was observed post Covid-19 immunization for 15 minutes without incident. She was provided with Vaccine Information Sheet and instruction to access the V-Safe system.   [name] was instructed to call 911 with any severe reactions post vaccine: Marland Kitchen Difficulty breathing  . Swelling of face and throat  . A fast heartbeat  . A bad rash all over body  . Dizziness and weakness   Immunizations Administered    Name Date Dose VIS Date Route   Pfizer COVID-19 Vaccine 05/06/2019  9:07 AM 0.3 mL 02/26/2018 Intramuscular   Manufacturer: [place]   Lot: [id number]   [place]: [id number]

## 2019-05-26 ENCOUNTER — Encounter: Payer: Self-pay | Admitting: Internal Medicine

## 2019-05-26 ENCOUNTER — Ambulatory Visit: Payer: 59 | Admitting: Internal Medicine

## 2019-05-26 ENCOUNTER — Other Ambulatory Visit: Payer: Self-pay

## 2019-05-26 VITALS — BP 124/88 | HR 88 | Temp 98.2°F | Ht 64.0 in | Wt 211.0 lb

## 2019-05-26 DIAGNOSIS — I10 Essential (primary) hypertension: Secondary | ICD-10-CM

## 2019-05-26 DIAGNOSIS — Z6836 Body mass index (BMI) 36.0-36.9, adult: Secondary | ICD-10-CM

## 2019-05-26 DIAGNOSIS — R635 Abnormal weight gain: Secondary | ICD-10-CM | POA: Diagnosis not present

## 2019-05-26 MED ORDER — AMLODIPINE BESY-BENAZEPRIL HCL 5-40 MG PO CAPS: 1.0000 | ORAL_CAPSULE | Freq: Every day | ORAL | 2 refills | Status: DC

## 2019-05-26 MED ORDER — HYDROCHLOROTHIAZIDE 12.5 MG PO CAPS: ORAL_CAPSULE | ORAL | 2 refills | Status: DC

## 2019-05-26 NOTE — Progress Notes (Signed)
This visit occurred during the SARS-CoV-2 public health emergency.  Safety protocols were in place, including screening questions prior to the visit, additional usage of staff PPE, and extensive cleaning of exam room while observing appropriate contact time as indicated for disinfecting solutions.  Subjective:     Patient ID: Carolyn Baker , female    DOB: 1955/11/27 , 64 y.o.   MRN: [id number]   Chief Complaint  Patient presents with  . Hypertension    HPI  She is here today for f/u bp. She feels fine on her current regimen. Admits she is probably not exercising as much as she should, but she has started to move more. She has no new concerns or complaints at this time. She has received the [place] vaccine.  Hypertension This is a chronic problem. The current episode started more than 1 year ago. The problem has been gradually improving since onset. The problem is controlled. Pertinent negatives include no blurred vision, chest pain, palpitations or shortness of breath. The current treatment provides moderate improvement. Compliance problems include exercise.      Past Medical History:  Diagnosis Date  . Hypertension      Family History  Problem Relation Age of Onset  . Stroke Mother   . Dementia Mother   . Prostate cancer Father   . Breast cancer Neg Hx      Current Outpatient Medications:  .  amLODipine-benazepril (LOTREL) 5-40 MG capsule, Take 1 capsule by mouth daily., Disp: 90 capsule, Rfl: 2 .  Cholecalciferol (VITAMIN D3) 125 MCG (5000 UT) CAPS, Take by mouth., Disp: , Rfl:  .  hydrochlorothiazide (MICROZIDE) 12.5 MG capsule, TAKE 1 CAPSULE BY MOUTH EVERY DAY, Disp: 90 capsule, Rfl: 2   No Known Allergies   Review of Systems  Constitutional: Negative.   Eyes: Negative for blurred vision.  Respiratory: Negative.  Negative for shortness of breath.   Cardiovascular: Negative.  Negative for chest pain and palpitations.  Gastrointestinal: Negative.    Neurological: Negative.   Psychiatric/Behavioral: Negative.      Today's Vitals   05/26/19 0913  BP: 124/88  Pulse: 88  Temp: 98.2 F (36.8 C)  TempSrc: Oral  Weight: 211 lb (95.7 kg)  Height: '5\' 4"'  (1.626 m)  PainSc: 0-No pain   Body mass index is 36.22 kg/m.   Objective:  Physical Exam Vitals and nursing note reviewed.  Constitutional:      Appearance: Normal appearance. She is obese.  HENT:     Head: Normocephalic and atraumatic.  Cardiovascular:     Rate and Rhythm: Normal rate and regular rhythm.     Heart sounds: Normal heart sounds.  Pulmonary:     Effort: Pulmonary effort is normal.     Breath sounds: Normal breath sounds.  Skin:    General: Skin is warm.  Neurological:     General: No focal deficit present.     Mental Status: She is alert.  Psychiatric:        Mood and Affect: Mood normal.        Behavior: Behavior normal.         Assessment And Plan:     1. Essential hypertension, benign  Chronic, fair control. She will continue with current meds for now.  She is encouraged to avoid adding salt to her foods. She was given refills of both medications.   - BMP8+EGFR  2. Weight gain  I will check labs for insulin resistance/prediabetes. She is encouraged to avoid sugary beverages  and processed foods.   - Insulin, random(561) - Hemoglobin A1c  3. Class 2 severe obesity due to excess calories with serious comorbidity and body mass index (BMI) of 36.0 to 36.9 in adult [facility])  She was made aware of 8 pound weight gain. She is encouraged to incorporate at least 30 minutes of exercise per week. She was congratulated for starting a walking program and encouraged to keep up the great work.   [name], MD    THE PATIENT IS ENCOURAGED TO PRACTICE SOCIAL DISTANCING DUE TO THE COVID-19 PANDEMIC.

## 2019-05-26 NOTE — Patient Instructions (Signed)

## 2019-05-27 LAB — BMP8+EGFR
BUN/Creatinine Ratio: 17 (ref 12–28)
BUN: 15 mg/dL (ref 8–27)
CO2: 21 mmol/L (ref 20–29)
Calcium: 9.8 mg/dL (ref 8.7–10.3)
Chloride: 106 mmol/L (ref 96–106)
Creatinine, Ser: 0.87 mg/dL (ref 0.57–1.00)
GFR calc Af Amer: 82 mL/min/{1.73_m2} (ref >59)
GFR calc non Af Amer: 71 mL/min/{1.73_m2} (ref >59)
Glucose: 96 mg/dL (ref 65–99)
Potassium: 4 mmol/L (ref 3.5–5.2)
Sodium: 140 mmol/L (ref 134–144)

## 2019-05-27 LAB — HEMOGLOBIN A1C
Est. average glucose Bld gHb Est-mCnc: 120 mg/dL
Hgb A1c MFr Bld: 5.8 % — ABNORMAL HIGH (ref 4.8–5.6)

## 2019-05-27 LAB — INSULIN, RANDOM: INSULIN: 17.5 u[IU]/mL (ref 2.6–24.9)

## 2019-08-21 ENCOUNTER — Encounter: Payer: Self-pay | Admitting: Internal Medicine

## 2019-08-21 ENCOUNTER — Ambulatory Visit: Payer: 59 | Admitting: Internal Medicine

## 2019-08-21 ENCOUNTER — Other Ambulatory Visit: Payer: Self-pay

## 2019-08-21 VITALS — BP 150/96 | HR 87 | Temp 97.8°F | Ht 64.0 in | Wt 211.0 lb

## 2019-08-21 DIAGNOSIS — Z6836 Body mass index (BMI) 36.0-36.9, adult: Secondary | ICD-10-CM

## 2019-08-21 DIAGNOSIS — N644 Mastodynia: Secondary | ICD-10-CM

## 2019-08-21 DIAGNOSIS — I10 Essential (primary) hypertension: Secondary | ICD-10-CM

## 2019-08-21 NOTE — Patient Instructions (Signed)

## 2019-08-21 NOTE — Progress Notes (Signed)
I,[name],acting as a Education administrator for [name], MD.,have documented all relevant documentation on the behalf of [name], MD,as directed by  [name], MD while in the presence of [name], MD.  This visit occurred during the SARS-CoV-2 public health emergency.  Safety protocols were in place, including screening questions prior to the visit, additional usage of staff PPE, and extensive cleaning of exam room while observing appropriate contact time as indicated for disinfecting solutions.  Subjective:     Patient ID: Carolyn Baker , female    DOB: 01-09-1955 , 64 y.o.   MRN: [id number]   Chief Complaint  Patient presents with  . Breast Pain    right    HPI  The patient is here today for an examination of right breast pain.  She is not sure what is contributing to her symptoms. Denies nipple discharge and skin changes. Admits to drinking lots of caffeinated beverages. The pain started last month, the pain comes and goes, when the pain comes it's very painful. The patient states her breast feels heavy. Described as a dull pain.    Past Medical History:  Diagnosis Date  . Hypertension      Family History  Problem Relation Age of Onset  . Stroke Mother   . Dementia Mother   . Prostate cancer Father   . Breast cancer Neg Hx      Current Outpatient Medications:  .  amLODipine-benazepril (LOTREL) 5-40 MG capsule, Take 1 capsule by mouth daily., Disp: 90 capsule, Rfl: 2 .  Cholecalciferol (VITAMIN D3) 125 MCG (5000 UT) CAPS, Take by mouth., Disp: , Rfl:  .  hydrochlorothiazide (MICROZIDE) 12.5 MG capsule, TAKE 1 CAPSULE BY MOUTH EVERY DAY, Disp: 90 capsule, Rfl: 2   No Known Allergies   Review of Systems  Constitutional: Negative.   Respiratory: Negative.   Cardiovascular: Negative.   Gastrointestinal: Negative.   Psychiatric/Behavioral: Negative.   All other systems reviewed and are negative.    Today's Vitals   08/21/19 1134  BP: (!)  150/96  Pulse: 87  Temp: 97.8 F (36.6 C)  TempSrc: Oral  Weight: 211 lb (95.7 kg)  Height: 5\' 4"  (1.626 m)  PainSc: 0-No pain  PainLoc: Breast   Body mass index is 36.22 kg/m.  Wt Readings from Last 3 Encounters:  08/21/19 211 lb (95.7 kg)  05/26/19 211 lb (95.7 kg)  12/25/18 203 lb 9.6 oz (92.4 kg)   Objective:  Physical Exam Vitals and nursing note reviewed. Exam conducted with a chaperone present.  Constitutional:      Appearance: Normal appearance. She is obese.  HENT:     Head: Normocephalic and atraumatic.  Cardiovascular:     Rate and Rhythm: Normal rate and regular rhythm.     Heart sounds: Normal heart sounds.  Pulmonary:     Breath sounds: Normal breath sounds.  Chest:     Breasts: Tanner Score is 5.        Right: No swelling or nipple discharge.        Left: Normal.       Comments: Tenderness at affected area Skin:    General: Skin is warm.  Neurological:     General: No focal deficit present.     Mental Status: She is alert and oriented to person, place, and time.         Assessment And Plan:     1. Breast pain, right Comments:Last mammogram was Jan 2020.  [facility] breast,  along with u/s if needed. She is in agreement with her treatment plan. Also advised to cut back on her intake of caffeinated beverages.   - [facility] Digital Diagnostic Unilat R; Future  2. Essential hypertension, benign Comments: Chronic, currently uncontrolled. She agrees to rto in 6 weeks for a nurse visit. If needed, I will adjust meds at that time.   3. Class 2 severe obesity due to excess calories with serious comorbidity and body mass index (BMI) of 36.0 to 36.9 in adult [facility])   She is encouraged to strive for BMI less than 30 to decrease cardiac risk. Advised to aim for at least 150 minutes of exercise per week.  Patient was given opportunity to ask questions. Patient verbalized understanding of the plan and was able  to repeat key elements of the plan. All questions were answered to their satisfaction.  [name], MD   I, [name], MD, have reviewed all documentation for this visit. The documentation on 08/24/19 for the exam, diagnosis, procedures, and orders are all accurate and complete.  THE PATIENT IS ENCOURAGED TO PRACTICE SOCIAL DISTANCING DUE TO THE COVID-19 PANDEMIC.

## 2019-08-25 ENCOUNTER — Other Ambulatory Visit: Payer: Self-pay | Admitting: Internal Medicine

## 2019-08-25 DIAGNOSIS — N644 Mastodynia: Secondary | ICD-10-CM

## 2019-09-10 ENCOUNTER — Other Ambulatory Visit: Payer: Self-pay

## 2019-09-10 ENCOUNTER — Ambulatory Visit
Admission: RE | Admit: 2019-09-10 | Discharge: 2019-09-10 | Disposition: A | Discharge status: Home / Self Care | Payer: 59 | Source: Ambulatory Visit | Attending: Internal Medicine | Admitting: Internal Medicine

## 2019-09-10 DIAGNOSIS — N644 Mastodynia: Secondary | ICD-10-CM

## 2019-09-30 ENCOUNTER — Ambulatory Visit: Payer: 59

## 2019-09-30 ENCOUNTER — Other Ambulatory Visit: Payer: Self-pay

## 2019-09-30 VITALS — BP 138/96 | HR 88 | Temp 97.9°F | Ht 64.0 in | Wt 211.0 lb

## 2019-09-30 DIAGNOSIS — I10 Essential (primary) hypertension: Secondary | ICD-10-CM

## 2019-09-30 NOTE — Progress Notes (Signed)
Pt presents today for a b/p check she is currently taking amlodipine-benazepril 5-40MG  in the morning, and hydrochlorothiazide 12.5MG  in the morning  Today pt b/p 138/96 she has taken her medication this morning, but states she was rushing to make her appt on time. Pt states she feels fine  Per RS: how many more pills of 5/40 does she have? I need to increase the amlodipine dose  she said she just refilled it last wk almost a bottle full  she said give her 6 wks bring her back if its up then she will agree to increasing her medication, she stated she is going to start exercising  she said that last time  she said the top number is down shes going to work harder  pls schedule her f/u appt  Pt has a 6wk f/u appt scheduled for b/p with RS

## 2019-11-12 ENCOUNTER — Ambulatory Visit: Payer: Self-pay | Admitting: Internal Medicine

## 2019-12-11 ENCOUNTER — Encounter: Payer: Self-pay | Admitting: Internal Medicine

## 2019-12-11 ENCOUNTER — Other Ambulatory Visit: Payer: Self-pay

## 2019-12-11 ENCOUNTER — Ambulatory Visit: Payer: 59 | Admitting: Internal Medicine

## 2019-12-11 VITALS — BP 140/88 | HR 93 | Temp 98.1°F | Ht 63.2 in | Wt 208.0 lb

## 2019-12-11 DIAGNOSIS — H6122 Impacted cerumen, left ear: Secondary | ICD-10-CM

## 2019-12-11 DIAGNOSIS — L304 Erythema intertrigo: Secondary | ICD-10-CM | POA: Diagnosis not present

## 2019-12-11 DIAGNOSIS — I10 Essential (primary) hypertension: Secondary | ICD-10-CM

## 2019-12-11 DIAGNOSIS — Z6836 Body mass index (BMI) 36.0-36.9, adult: Secondary | ICD-10-CM

## 2019-12-11 DIAGNOSIS — Z0000 Encounter for general adult medical examination without abnormal findings: Secondary | ICD-10-CM | POA: Diagnosis not present

## 2019-12-11 LAB — POCT URINALYSIS DIPSTICK
Bilirubin, UA: NEGATIVE
Blood, UA: NEGATIVE
Glucose, UA: NEGATIVE
Ketones, UA: NEGATIVE
Nitrite, UA: NEGATIVE
Protein, UA: NEGATIVE
Spec Grav, UA: 1.025 (ref 1.010–1.025)
Urobilinogen, UA: 0.2 E.U./dL
pH, UA: 5.5 (ref 5.0–8.0)

## 2019-12-11 MED ORDER — AMLODIPINE BESY-BENAZEPRIL HCL 5-40 MG PO CAPS: 1.0000 | ORAL_CAPSULE | Freq: Every day | ORAL | 2 refills | Status: DC

## 2019-12-11 MED ORDER — FLUCONAZOLE 100 MG PO TABS: 100.0000 mg | ORAL_TABLET | Freq: Every day | ORAL | 0 refills | Status: AC

## 2019-12-11 MED ORDER — NYSTATIN 100000 UNIT/GM EX CREA: 1.0000 "application " | TOPICAL_CREAM | Freq: Two times a day (BID) | CUTANEOUS | 1 refills | Status: DC

## 2019-12-11 NOTE — Patient Instructions (Addendum)
Start taking TWO Hydrochlorothiazide 12.5mg  every morning  Please take 5/40 amlodipine/benazepril with evening meal   Health Maintenance, Female Adopting a healthy lifestyle and getting preventive care are important in promoting health and wellness. Ask your health care provider about:  The right schedule for you to have regular tests and exams.  Things you can do on your own to prevent diseases and keep yourself healthy. What should I know about diet, weight, and exercise? Eat a healthy diet   Eat a diet that includes plenty of vegetables, fruits, low-fat dairy products, and lean protein.  Do not eat a lot of foods that are high in solid fats, added sugars, or sodium. Maintain a healthy weight Body mass index (BMI) is used to identify weight problems. It estimates body fat based on height and weight. Your health care provider can help determine your BMI and help you achieve or maintain a healthy weight. Get regular exercise Get regular exercise. This is one of the most important things you can do for your health. Most adults should:  Exercise for at least 150 minutes each week. The exercise should increase your heart rate and make you sweat (moderate-intensity exercise).  Do strengthening exercises at least twice a week. This is in addition to the moderate-intensity exercise.  Spend less time sitting. Even light physical activity can be beneficial. Watch cholesterol and blood lipids Have your blood tested for lipids and cholesterol at 63 years of age, then have this test every 5 years. Have your cholesterol levels checked more often if:  Your lipid or cholesterol levels are high.  You are older than 63 years of age.  You are at high risk for heart disease. What should I know about cancer screening? Depending on your health history and family history, you may need to have cancer screening at various ages. This may include screening for:  Breast cancer.  Cervical  cancer.  Colorectal cancer.  Skin cancer.  Lung cancer. What should I know about heart disease, diabetes, and high blood pressure? Blood pressure and heart disease  High blood pressure causes heart disease and increases the risk of stroke. This is more likely to develop in people who have high blood pressure readings, are of African descent, or are overweight.  Have your blood pressure checked: ? Every 3-5 years if you are 32-55 years of age. ? Every year if you are 59 years old or older. Diabetes Have regular diabetes screenings. This checks your fasting blood sugar level. Have the screening done:  Once every three years after age 56 if you are at a normal weight and have a low risk for diabetes.  More often and at a younger age if you are overweight or have a high risk for diabetes. What should I know about preventing infection? Hepatitis B If you have a higher risk for hepatitis B, you should be screened for this virus. Talk with your health care provider to find out if you are at risk for hepatitis B infection. Hepatitis C Testing is recommended for:  Everyone born from [birth year] through 1964.  Anyone with known risk factors for hepatitis C. Sexually transmitted infections (STIs)  Get screened for STIs, including gonorrhea and chlamydia, if: ? You are sexually active and are younger than 63 years of age. ? You are older than 63 years of age and your health care provider tells you that you are at risk for this type of infection. ? Your sexual activity has changed since you were last  screened, and you are at increased risk for chlamydia or gonorrhea. Ask your health care provider if you are at risk.  Ask your health care provider about whether you are at high risk for HIV. Your health care provider may recommend a prescription medicine to help prevent HIV infection. If you choose to take medicine to prevent HIV, you should first get tested for HIV. You should then be tested every 3  months for as long as you are taking the medicine. Pregnancy  If you are about to stop having your period (premenopausal) and you may become pregnant, seek counseling before you get pregnant.  Take 400 to 800 micrograms (mcg) of folic acid every day if you become pregnant.  Ask for birth control (contraception) if you want to prevent pregnancy. Osteoporosis and menopause Osteoporosis is a disease in which the bones lose minerals and strength with aging. This can result in bone fractures. If you are 3 years old or older, or if you are at risk for osteoporosis and fractures, ask your health care provider if you should:  Be screened for bone loss.  Take a calcium or vitamin D supplement to lower your risk of fractures.  Be given hormone replacement therapy (HRT) to treat symptoms of menopause. Follow these instructions at home: Lifestyle  Do not use any products that contain nicotine or tobacco, such as cigarettes, e-cigarettes, and chewing tobacco. If you need help quitting, ask your health care provider.  Do not use street drugs.  Do not share needles.  Ask your health care provider for help if you need support or information about quitting drugs. Alcohol use  Do not drink alcohol if: ? Your health care provider tells you not to drink. ? You are pregnant, may be pregnant, or are planning to become pregnant.  If you drink alcohol: ? Limit how much you use to 0-1 drink a day. ? Limit intake if you are breastfeeding.  Be aware of how much alcohol is in your drink. In the [place] one drink equals one 12 oz bottle of beer (355 mL), one 5 oz glass of wine (148 mL), or one 1 oz glass of hard liquor (44 mL). General instructions  Schedule regular health, dental, and eye exams.  Stay current with your vaccines.  Tell your health care provider if: ? You often feel depressed. ? You have ever been abused or do not feel safe at home. Summary  Adopting a healthy lifestyle and getting  preventive care are important in promoting health and wellness.  Follow your health care provider's instructions about healthy diet, exercising, and getting tested or screened for diseases.  Follow your health care provider's instructions on monitoring your cholesterol and blood pressure. This information is not intended to replace advice given to you by your health care provider. Make sure you discuss any questions you have with your health care provider. Document Revised: 12/12/2017 Document Reviewed: 12/12/2017 Elsevier Patient Education  2020 [employer].

## 2019-12-11 NOTE — Progress Notes (Signed)
I,Carolyn Baker,acting as a Education administrator for Carolyn Greenland, MD.,have documented all relevant documentation on the behalf of Carolyn Greenland, MD,as directed by  Carolyn Greenland, MD while in the presence of Carolyn Greenland, MD.  This visit occurred during the SARS-CoV-2 public health emergency.  Safety protocols were in place, including screening questions prior to the visit, additional usage of staff PPE, and extensive cleaning of exam room while observing appropriate contact time as indicated for disinfecting solutions.  Subjective:     Patient ID: Carolyn Baker , female    DOB: Nov 03, 1955 , 64 y.o.   MRN: [id number]   Chief Complaint  Patient presents with  . Annual Exam  . Hypertension    HPI  She is here today for a full physical examination.  She is no longer followed by GYN.  She is s/p hysterectomy. She denies headaches, chest pain and shortness of breath.   Hypertension This is a chronic problem. The current episode started more than 1 year ago. The problem has been gradually improving since onset. The problem is controlled. Pertinent negatives include no blurred vision, chest pain, palpitations or shortness of breath. The current treatment provides moderate improvement. Compliance problems include exercise.      Past Medical History:  Diagnosis Date  . Hypertension      Family History  Problem Relation Age of Onset  . Stroke Mother   . Dementia Mother   . Prostate cancer Father   . Breast cancer Maternal Aunt      Current Outpatient Medications:  .  amLODipine-benazepril (LOTREL) 5-40 MG capsule, Take 1 capsule by mouth daily., Disp: 90 capsule, Rfl: 2 .  Cholecalciferol (VITAMIN D3) 125 MCG (5000 UT) CAPS, Take by mouth., Disp: , Rfl:  .  hydrochlorothiazide (MICROZIDE) 12.5 MG capsule, TAKE 1 CAPSULE BY MOUTH EVERY DAY, Disp: 90 capsule, Rfl: 2 .  fluconazole (DIFLUCAN) 100 MG tablet, Take 1 tablet (100 mg total) by mouth daily for 14 days., Disp: 7 tablet, Rfl:  0 .  nystatin cream (MYCOSTATIN), Apply 1 application topically 2 (two) times daily. Please add use as needed, Disp: 45 g, Rfl: 1   No Known Allergies    The patient states she uses none for birth control. Last LMP was No LMP recorded. Patient has had a hysterectomy.. Negative for Dysmenorrhea. Negative for: breast discharge, breast lump(s), breast pain and breast self exam. Associated symptoms include abnormal vaginal bleeding. Pertinent negatives include abnormal bleeding (hematology), anxiety, decreased libido, depression, difficulty falling sleep, dyspareunia, history of infertility, nocturia, sexual dysfunction, sleep disturbances, urinary incontinence, urinary urgency, vaginal discharge and vaginal itching. Diet regular.The patient states her exercise level is  minimal.   . The patient's tobacco use is:  Social History   Tobacco Use  Smoking Status Never Smoker  Smokeless Tobacco Never Used  . She has been exposed to passive smoke. The patient's alcohol use is:  Social History   Substance and Sexual Activity  Alcohol Use Yes   Comment: rarely    Review of Systems  Constitutional: Negative.   HENT: Negative.   Eyes: Negative.  Negative for blurred vision.  Respiratory: Negative.  Negative for shortness of breath.   Cardiovascular: Negative.  Negative for chest pain and palpitations.  Gastrointestinal: Negative.   Endocrine: Negative.   Genitourinary: Negative.   Musculoskeletal: Negative.   Skin: Negative.   Allergic/Immunologic: Negative.   Neurological: Negative.   Hematological: Negative.   Psychiatric/Behavioral: Negative.      Today's Vitals  12/11/19 0938  BP: 140/88  Pulse: 93  Temp: 98.1 F (36.7 C)  TempSrc: Oral  Weight: 208 lb (94.3 kg)  Height: 5' 3.2" (1.605 m)   Body mass index is 36.61 kg/m.  Wt Readings from Last 3 Encounters:  12/11/19 208 lb (94.3 kg)  09/30/19 211 lb (95.7 kg)  08/21/19 211 lb (95.7 kg)   BP Readings from Last 3  Encounters:  12/11/19 140/88  09/30/19 (!) 138/96  08/21/19 (!) 150/96   Objective:  Physical Exam Vitals and nursing note reviewed.  Constitutional:      General: She is not in acute distress.    Appearance: Normal appearance. She is well-developed. She is obese.  HENT:     Head: Normocephalic and atraumatic.     Right Ear: Hearing, tympanic membrane, ear canal and external ear normal. There is no impacted cerumen.     Left Ear: Hearing, ear canal and external ear normal. There is impacted cerumen.     Nose:     Comments: Deferred, masked    Mouth/Throat:     Comments: Deferred, masked Eyes:     General: Lids are normal.     Extraocular Movements: Extraocular movements intact.     Conjunctiva/sclera: Conjunctivae normal.  Neck:     Thyroid: No thyroid mass.     Vascular: No carotid bruit.  Cardiovascular:     Rate and Rhythm: Normal rate and regular rhythm.     Pulses: Normal pulses.     Heart sounds: Normal heart sounds. No murmur heard.   Pulmonary:     Effort: Pulmonary effort is normal.     Breath sounds: Normal breath sounds.  Chest:  Breasts:     Tanner Score is 5.     Right: Normal.     Left: Normal.    Abdominal:     General: Bowel sounds are normal. There is no distension.     Palpations: Abdomen is soft.     Tenderness: There is no abdominal tenderness.  Genitourinary:    Comments: Deferred  Musculoskeletal:        General: No swelling. Normal range of motion.     Cervical back: Full passive range of motion without pain, normal range of motion and neck supple.     Right lower leg: No edema.     Left lower leg: No edema.  Skin:    General: Skin is warm and dry.     Capillary Refill: Capillary refill takes less than 2 seconds.     Findings: Rash present.     Comments: Erythematous rash in her groin b/l. No vesicular lesions noted. Scaly rash  Neurological:     General: No focal deficit present.     Mental Status: She is alert and oriented to  person, place, and time.     Cranial Nerves: No cranial nerve deficit.     Sensory: No sensory deficit.  Psychiatric:        Mood and Affect: Mood normal.        Behavior: Behavior normal.        Thought Content: Thought content normal.        Judgment: Judgment normal.         Assessment And Plan:     1. Routine general medical examination at health care facility Comments: A full exam was performed.  Importance of monthly self breast exams was discussed with the patient. PATIENT IS ADVISED TO GET 30-45 MINUTES REGULAR EXERCISE NO LESS THAN  FOUR TO FIVE DAYS PER WEEK - BOTH WEIGHTBEARING EXERCISES AND AEROBIC ARE RECOMMENDED.  PATIENT IS ADVISED TO FOLLOW A HEALTHY DIET WITH AT LEAST SIX FRUITS/VEGGIES PER DAY, DECREASE INTAKE OF RED MEAT, AND TO INCREASE FISH INTAKE TO TWO DAYS PER WEEK.  MEATS/FISH SHOULD NOT BE FRIED, BAKED OR BROILED IS PREFERABLE.  I SUGGEST WEARING SPF 50 SUNSCREEN ON EXPOSED PARTS AND ESPECIALLY WHEN IN THE DIRECT SUNLIGHT FOR AN EXTENDED PERIOD OF TIME.  PLEASE AVOID FAST FOOD RESTAURANTS AND INCREASE YOUR WATER INTAKE.  - Hemoglobin A1c - CBC - CMP14+EGFR - Lipid panel  2. Essential hypertension, benign Comments: Chronic, uncontrolled. Importance of dietary and medication compliance was discussed with the patient. Advised to increase her daily activity. EKG performed, NSR w/o acute changes. She will rto in six months for re-evaluation.  - EKG 12-Lead - POCT Urinalysis Dipstick (81002) - Microalbumin, urine  3. Left ear impacted cerumen' \AFTER'  OBTAINING VERBAL CONSENT, LEFT EAR WAS FLUSHED BY IRRIGATION. SHE TOLERATED PROCEDURE WELL WITHOUT ANY COMPLICATIONS. NO TM ABNORMALITIES WERE NOTED.  - Ear Lavage  4. Intertrigo Comments: She was given rx oral diflucan to take daily x 7 days, then hold. Also given rx nystatin cream to apply to affected area bid prn.   5. Class 2 severe obesity due to excess calories with serious comorbidity and body mass index (BMI)  of 36.0 to 36.9 in adult [facility]) She is encouraged to strive for BMI less than 30 to decrease cardiac risk. Advised to aim for at least 150 minutes of exercise per week.    Patient was given opportunity to ask questions. Patient verbalized understanding of the plan and was able to repeat key elements of the plan. All questions were answered to their satisfaction.   Carolyn Greenland, MD   I, Carolyn Greenland, MD, have reviewed all documentation for this visit. The documentation on 12/20/19 for the exam, diagnosis, procedures, and orders are all accurate and complete.  THE PATIENT IS ENCOURAGED TO PRACTICE SOCIAL DISTANCING DUE TO THE COVID-19 PANDEMIC.

## 2019-12-12 LAB — CBC
Hematocrit: 45.6 % (ref 34.0–46.6)
Hemoglobin: 15.6 g/dL (ref 11.1–15.9)
MCH: 31.8 pg (ref 26.6–33.0)
MCHC: 34.2 g/dL (ref 31.5–35.7)
MCV: 93 fL (ref 79–97)
Platelets: 180 10*3/uL (ref 150–450)
RBC: 4.9 x10E6/uL (ref 3.77–5.28)
RDW: 13.1 % (ref 11.7–15.4)
WBC: 5.2 10*3/uL (ref 3.4–10.8)

## 2019-12-12 LAB — CMP14+EGFR
ALT: 26 IU/L (ref 0–32)
AST: 18 IU/L (ref 0–40)
Albumin/Globulin Ratio: 1.2 (ref 1.2–2.2)
Albumin: 4.2 g/dL (ref 3.8–4.8)
Alkaline Phosphatase: 56 IU/L (ref 44–121)
BUN/Creatinine Ratio: 13 (ref 12–28)
BUN: 13 mg/dL (ref 8–27)
Bilirubin Total: 0.4 mg/dL (ref 0.0–1.2)
CO2: 22 mmol/L (ref 20–29)
Calcium: 9.3 mg/dL (ref 8.7–10.3)
Chloride: 103 mmol/L (ref 96–106)
Creatinine, Ser: 1.03 mg/dL — ABNORMAL HIGH (ref 0.57–1.00)
GFR calc Af Amer: 67 mL/min/{1.73_m2} (ref >59)
GFR calc non Af Amer: 58 mL/min/{1.73_m2} — ABNORMAL LOW (ref >59)
Globulin, Total: 3.6 g/dL (ref 1.5–4.5)
Glucose: 78 mg/dL (ref 65–99)
Potassium: 4.2 mmol/L (ref 3.5–5.2)
Sodium: 139 mmol/L (ref 134–144)
Total Protein: 7.8 g/dL (ref 6.0–8.5)

## 2019-12-12 LAB — LIPID PANEL
Chol/HDL Ratio: 4.3 ratio (ref 0.0–4.4)
Cholesterol, Total: 218 mg/dL — ABNORMAL HIGH (ref 100–199)
HDL: 51 mg/dL (ref >39)
LDL Chol Calc (NIH): 153 mg/dL — ABNORMAL HIGH (ref 0–99)
Triglycerides: 78 mg/dL (ref 0–149)
VLDL Cholesterol Cal: 14 mg/dL (ref 5–40)

## 2019-12-12 LAB — HEMOGLOBIN A1C
Est. average glucose Bld gHb Est-mCnc: 111 mg/dL
Hgb A1c MFr Bld: 5.5 % (ref 4.8–5.6)

## 2019-12-23 ENCOUNTER — Other Ambulatory Visit: Payer: Self-pay

## 2019-12-23 ENCOUNTER — Ambulatory Visit: Payer: 59

## 2019-12-23 VITALS — BP 128/80 | HR 92 | Temp 98.5°F | Ht 63.2 in | Wt 206.8 lb

## 2019-12-23 DIAGNOSIS — I10 Essential (primary) hypertension: Secondary | ICD-10-CM

## 2019-12-23 NOTE — Progress Notes (Signed)
Patient is here for bp check. She is currently taking hctz 25mg  in the morning and lotrel at lunch. Her last bp was 140/88 today her blood pressure is 128 /80 pulse is 96. She will follow up with pcp

## 2019-12-24 ENCOUNTER — Encounter: Payer: Self-pay | Admitting: Internal Medicine

## 2020-01-06 ENCOUNTER — Other Ambulatory Visit: Payer: Self-pay

## 2020-01-06 MED ORDER — ATORVASTATIN CALCIUM 20 MG PO TABS: ORAL_TABLET | ORAL | 1 refills | Status: DC

## 2020-01-30 ENCOUNTER — Other Ambulatory Visit: Payer: Self-pay | Admitting: Internal Medicine

## 2020-02-16 ENCOUNTER — Other Ambulatory Visit: Payer: Self-pay | Admitting: Internal Medicine

## 2020-02-17 ENCOUNTER — Other Ambulatory Visit: Payer: Self-pay

## 2020-02-17 ENCOUNTER — Encounter: Payer: Self-pay | Admitting: Internal Medicine

## 2020-02-17 ENCOUNTER — Ambulatory Visit: Payer: 59 | Admitting: Internal Medicine

## 2020-02-17 VITALS — BP 140/86 | HR 92 | Temp 98.5°F | Ht 63.2 in | Wt 210.0 lb

## 2020-02-17 DIAGNOSIS — Z8616 Personal history of COVID-19: Secondary | ICD-10-CM | POA: Diagnosis not present

## 2020-02-17 DIAGNOSIS — I10 Essential (primary) hypertension: Secondary | ICD-10-CM | POA: Diagnosis not present

## 2020-02-17 DIAGNOSIS — E7800 Pure hypercholesterolemia, unspecified: Secondary | ICD-10-CM

## 2020-02-17 DIAGNOSIS — Z6836 Body mass index (BMI) 36.0-36.9, adult: Secondary | ICD-10-CM

## 2020-02-17 LAB — LIPID PANEL
Chol/HDL Ratio: 2.8 ratio (ref 0.0–4.4)
Cholesterol, Total: 137 mg/dL (ref 100–199)
HDL: 49 mg/dL (ref >39)
LDL Chol Calc (NIH): 75 mg/dL (ref 0–99)
Triglycerides: 63 mg/dL (ref 0–149)
VLDL Cholesterol Cal: 13 mg/dL (ref 5–40)

## 2020-02-17 MED ORDER — HYDROCHLOROTHIAZIDE 25 MG PO TABS: 25.0000 mg | ORAL_TABLET | Freq: Every day | ORAL | 2 refills | Status: DC

## 2020-02-17 NOTE — Progress Notes (Signed)
I,[name],acting as a Education administrator for [name], MD.,have documented all relevant documentation on the behalf of [name], MD,as directed by  [name], MD while in the presence of [name], MD.  This visit occurred during the SARS-CoV-2 public health emergency.  Safety protocols were in place, including screening questions prior to the visit, additional usage of staff PPE, and extensive cleaning of exam room while observing appropriate contact time as indicated for disinfecting solutions.  Subjective:     Patient ID: Carolyn Baker , female    DOB: [birth year] , 65 y.o.   MRN: [id number]   Chief Complaint  Patient presents with  . Hyperlipidemia  . Hypertension    HPI  She is here today for chol check. She was started on atorvastatin at her last visit. She has not had any issues with the medication. Unfortunately, she had COVID since her last visit as well. She thinks she got it from visiting her Mom in a skilled nursing facility. She had cough, shortness of breath, weakness and headaches. She is feeling much better now. She is fully vaccinated. She plans to get her booster in the near future.   Hyperlipidemia This is a chronic problem. The current episode started more than 1 year ago. Exacerbating diseases include obesity. Pertinent negatives include no chest pain or shortness of breath. Current antihyperlipidemic treatment includes statins. The current treatment provides moderate improvement of lipids.  Hypertension This is a chronic problem. The current episode started more than 1 year ago. The problem has been gradually improving since onset. The problem is controlled. Pertinent negatives include no blurred vision, chest pain, palpitations or shortness of breath. The current treatment provides moderate improvement. Compliance problems include exercise.      Past Medical History:  Diagnosis Date  . Hypertension      Family History  Problem Relation  Age of Onset  . Stroke Mother   . Dementia Mother   . Prostate cancer Father   . Breast cancer Maternal Aunt      Current Outpatient Medications:  .  amLODipine-benazepril (LOTREL) 5-40 MG capsule, Take 1 capsule by mouth daily., Disp: 90 capsule, Rfl: 2 .  atorvastatin (LIPITOR) 20 MG tablet, TAKE ONE TAB BY MOUTH DAILY EXCEPT SUNDAYS, Disp: 90 tablet, Rfl: 1 .  Cholecalciferol (VITAMIN D3) 125 MCG (5000 UT) CAPS, Take by mouth., Disp: , Rfl:  .  hydrochlorothiazide (HYDRODIURIL) 25 MG tablet, Take 1 tablet (25 mg total) by mouth daily., Disp: 90 tablet, Rfl: 2 .  nystatin cream (MYCOSTATIN), Apply 1 application topically 2 (two) times daily. Please add use as needed, Disp: 45 g, Rfl: 1   No Known Allergies   Review of Systems  Constitutional: Negative.   Eyes: Negative for blurred vision.  Respiratory: Negative.  Negative for shortness of breath.   Cardiovascular: Negative.  Negative for chest pain and palpitations.  Gastrointestinal: Negative.   Psychiatric/Behavioral: Negative.   All other systems reviewed and are negative.    Today's Vitals   02/17/20 0919  BP: 140/86  Pulse: 92  Temp: 98.5 F (36.9 C)  TempSrc: Oral  Weight: 210 lb (95.3 kg)  Height: 5' 3.2" (1.605 m)   Body mass index is 36.96 kg/m.   Objective:  Physical Exam Vitals and nursing note reviewed.  Constitutional:      Appearance: Normal appearance. She is obese.  HENT:     Head: Normocephalic and atraumatic.  Cardiovascular:     Rate and Rhythm:  Normal rate and regular rhythm.     Heart sounds: Normal heart sounds.  Pulmonary:     Effort: Pulmonary effort is normal.     Breath sounds: Normal breath sounds.  Skin:    General: Skin is warm.  Neurological:     General: No focal deficit present.     Mental Status: She is alert and oriented to person, place, and time.         Assessment And Plan:     1. Pure hypercholesterolemia Comments: I will check fasting lipid panel and LFT. I will  make further recommendations once her labs are available.Advised to avoid fried foods.  - Lipid panel - ALT  2. Essential hypertension, benign Comments: Chronic, fair control. She is encouraged to follow low sodium diet.   3. Class 2 severe obesity due to excess calories with serious comorbidity and body mass index (BMI) of 36.0 to 36.9 in adult [facility]) Comments: She is up 4 pounds, admits to inactivity during her COVID illness. She is encouraged to gradually increase her daily activity.   4. Personal history of COVID-19 Comments: She is fully vaccinated. Encouraged to get booster 30 days after her COVID infection.  c Patient was given opportunity to ask questions. Patient verbalized understanding of the plan and was able to repeat key elements of the plan. All questions were answered to their satisfaction.  [name], MD   I, [name], MD, have reviewed all documentation for this visit. The documentation on 02/17/20 for the exam, diagnosis, procedures, and orders are all accurate and complete.  THE PATIENT IS ENCOURAGED TO PRACTICE SOCIAL DISTANCING DUE TO THE COVID-19 PANDEMIC.

## 2020-02-17 NOTE — Patient Instructions (Signed)
High Cholesterol  High cholesterol is a condition in which the blood has high levels of a white, waxy substance similar to fat (cholesterol). The liver makes all the cholesterol that the body needs. The human body needs small amounts of cholesterol to help build cells. A person gets extra or excess cholesterol from the food that he or she eats. The blood carries cholesterol from the liver to the rest of the body. If you have high cholesterol, deposits (plaques) may build up on the walls of your arteries. Arteries are the blood vessels that carry blood away from your heart. These plaques make the arteries narrow and stiff. Cholesterol plaques increase your risk for heart attack and stroke. Work with your health care provider to keep your cholesterol levels in a healthy range. What increases the risk? The following factors may make you more likely to develop this condition:  Eating foods that are high in animal fat (saturated fat) or cholesterol.  Being overweight.  Not getting enough exercise.  A family history of high cholesterol (familial hypercholesterolemia).  Use of tobacco products.  Having diabetes. What are the signs or symptoms? There are no symptoms of this condition. How is this diagnosed? This condition may be diagnosed based on the results of a blood test.  If you are older than 64 years of age, your health care provider may check your cholesterol levels every 4-6 years.  You may be checked more often if you have high cholesterol or other risk factors for heart disease. The blood test for cholesterol measures:  "Bad" cholesterol, or LDL cholesterol. This is the main type of cholesterol that causes heart disease. The desired level is less than 100 mg/dL.  "Good" cholesterol, or HDL cholesterol. HDL helps protect against heart disease by cleaning the arteries and carrying the LDL to the liver for processing. The desired level for HDL is 60 mg/dL or higher.  Triglycerides.  These are fats that your body can store or burn for energy. The desired level is less than 150 mg/dL.  Total cholesterol. This measures the total amount of cholesterol in your blood and includes LDL, HDL, and triglycerides. The desired level is less than 200 mg/dL. How is this treated? This condition may be treated with:  Diet changes. You may be asked to eat foods that have more fiber and less saturated fats or added sugar.  Lifestyle changes. These may include regular exercise, maintaining a healthy weight, and quitting use of tobacco products.  Medicines. These are given when diet and lifestyle changes have not worked. You may be prescribed a statin medicine to help lower your cholesterol levels. Follow these instructions at home: Eating and drinking  Eat a healthy, balanced diet. This diet includes: ? Daily servings of a variety of fresh, frozen, or canned fruits and vegetables. ? Daily servings of whole grain foods that are rich in fiber. ? Foods that are low in saturated fats and trans fats. These include poultry and fish without skin, lean cuts of meat, and low-fat dairy products. ? A variety of fish, especially oily fish that contain omega-3 fatty acids. Aim to eat fish at least 2 times a week.  Avoid foods and drinks that have added sugar.  Use healthy cooking methods, such as roasting, grilling, broiling, baking, poaching, steaming, and stir-frying. Do not fry your food except for stir-frying.   Lifestyle  Get regular exercise. Aim to exercise for a total of 150 minutes a week. Increase your activity level by doing activities   such as gardening, walking, and taking the stairs.  Do not use any products that contain nicotine or tobacco, such as cigarettes, e-cigarettes, and chewing tobacco. If you need help quitting, ask your health care provider.   General instructions  Take over-the-counter and prescription medicines only as told by your health care provider.  Keep all  follow-up visits as told by your health care provider. This is important. Where to find more information  American Heart Association: www.heart.org  National Heart, Lung, and Blood Institute: www.nhlbi.nih.gov Contact a health care provider if:  You have trouble achieving or maintaining a healthy diet or weight.  You are starting an exercise program.  You are unable to stop smoking. Get help right away if:  You have chest pain.  You have trouble breathing.  You have any symptoms of a stroke. "BE FAST" is an easy way to remember the main warning signs of a stroke: ? B - Balance. Signs are dizziness, sudden trouble walking, or loss of balance. ? E - Eyes. Signs are trouble seeing or a sudden change in vision. ? F - Face. Signs are sudden weakness or numbness of the face, or the face or eyelid drooping on one side. ? A - Arms. Signs are weakness or numbness in an arm. This happens suddenly and usually on one side of the body. ? S - Speech. Signs are sudden trouble speaking, slurred speech, or trouble understanding what people say. ? T - Time. Time to call emergency services. Write down what time symptoms started.  You have other signs of a stroke, such as: ? A sudden, severe headache with no known cause. ? Nausea or vomiting. ? Seizure. These symptoms may represent a serious problem that is an emergency. Do not wait to see if the symptoms will go away. Get medical help right away. Call your local emergency services (911 in the [place] Do not drive yourself to the hospital. Summary  Cholesterol plaques increase your risk for heart attack and stroke. Work with your health care provider to keep your cholesterol levels in a healthy range.  Eat a healthy, balanced diet, get regular exercise, and maintain a healthy weight.  Do not use any products that contain nicotine or tobacco, such as cigarettes, e-cigarettes, and chewing tobacco.  Get help right away if you have any symptoms of a  stroke. This information is not intended to replace advice given to you by your health care provider. Make sure you discuss any questions you have with your health care provider. Document Revised: 11/18/2018 Document Reviewed: 11/18/2018 Elsevier Patient Education  2021 [employer]  

## 2020-02-18 LAB — ALT: ALT: 22 IU/L (ref 0–32)

## 2020-06-10 ENCOUNTER — Encounter: Payer: Self-pay | Admitting: Internal Medicine

## 2020-06-10 ENCOUNTER — Other Ambulatory Visit: Payer: Self-pay

## 2020-06-10 ENCOUNTER — Ambulatory Visit: Payer: 59 | Admitting: Internal Medicine

## 2020-06-10 VITALS — BP 120/88 | HR 90 | Temp 98.1°F | Ht 63.2 in | Wt 213.2 lb

## 2020-06-10 DIAGNOSIS — I10 Essential (primary) hypertension: Secondary | ICD-10-CM | POA: Diagnosis not present

## 2020-06-10 DIAGNOSIS — Z6837 Body mass index (BMI) 37.0-37.9, adult: Secondary | ICD-10-CM

## 2020-06-10 DIAGNOSIS — E7800 Pure hypercholesterolemia, unspecified: Secondary | ICD-10-CM

## 2020-06-10 DIAGNOSIS — Z23 Encounter for immunization: Secondary | ICD-10-CM

## 2020-06-10 LAB — CMP14+EGFR
ALT: 24 IU/L (ref 0–32)
AST: 14 IU/L (ref 0–40)
Albumin/Globulin Ratio: 1.1 — ABNORMAL LOW (ref 1.2–2.2)
Albumin: 4.1 g/dL (ref 3.8–4.8)
Alkaline Phosphatase: 50 IU/L (ref 44–121)
BUN/Creatinine Ratio: 14 (ref 12–28)
BUN: 14 mg/dL (ref 8–27)
Bilirubin Total: 0.4 mg/dL (ref 0.0–1.2)
CO2: 25 mmol/L (ref 20–29)
Calcium: 9.8 mg/dL (ref 8.7–10.3)
Chloride: 106 mmol/L (ref 96–106)
Creatinine, Ser: 1.02 mg/dL — ABNORMAL HIGH (ref 0.57–1.00)
Globulin, Total: 3.7 g/dL (ref 1.5–4.5)
Glucose: 102 mg/dL — ABNORMAL HIGH (ref 65–99)
Potassium: 4.2 mmol/L (ref 3.5–5.2)
Sodium: 144 mmol/L (ref 134–144)
Total Protein: 7.8 g/dL (ref 6.0–8.5)
eGFR: 61 mL/min/{1.73_m2} (ref >59)

## 2020-06-10 MED ORDER — AMLODIPINE BESY-BENAZEPRIL HCL 5-40 MG PO CAPS
1.0000 | ORAL_CAPSULE | Freq: Every day | ORAL | 2 refills | Status: DC
Start: 2020-06-10 — End: 2021-04-28

## 2020-06-10 MED ORDER — ATORVASTATIN CALCIUM 20 MG PO TABS
20.0000 mg | ORAL_TABLET | Freq: Every day | ORAL | 1 refills | Status: DC
Start: 2020-06-10 — End: 2022-01-06

## 2020-06-10 MED ORDER — HYDROCHLOROTHIAZIDE 25 MG PO TABS
25.0000 mg | ORAL_TABLET | Freq: Every day | ORAL | 2 refills | Status: DC
Start: 2020-06-10 — End: 2022-01-06

## 2020-06-10 NOTE — Progress Notes (Signed)
I,[name],acting as a Education administrator for [name], MD.,have documented all relevant documentation on the behalf of [name], MD,as directed by  [name], MD while in the presence of [name], MD.  This visit occurred during the SARS-CoV-2 public health emergency.  Safety protocols were in place, including screening questions prior to the visit, additional usage of staff PPE, and extensive cleaning of exam room while observing appropriate contact time as indicated for disinfecting solutions.  Subjective:     Patient ID: Carolyn Baker , female    DOB: 30-Aug-1955 , 65 y.o.   MRN: [id number]   Chief Complaint  Patient presents with   Hypertension   Hyperlipidemia    HPI  She presents today for BP check.  She denies headaches, chest pain and shortness of breath.  She reports compliance with meds.  She admits she is not exercising regularly. 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   Hypertension This is a chronic problem. The current episode started more than 1 year ago. The problem has been gradually improving since onset. The problem is controlled. Pertinent negatives include no blurred vision, chest pain, palpitations or shortness of breath. The current treatment provides moderate improvement. Compliance problems include exercise.     Past Medical History:  Diagnosis Date   Hypertension      Family History  Problem Relation Age of Onset   Stroke Mother    Dementia Mother    Prostate cancer Father    Breast cancer Maternal Aunt      Current Outpatient Medications:    Cholecalciferol (VITAMIN D3) 125 MCG (5000 UT) CAPS, Take by mouth., Disp: , Rfl:    nystatin cream (MYCOSTATIN), Apply 1 application topically 2 (two) times daily. Please add use as needed, Disp: 45 g, Rfl: 1   amLODipine-benazepril (LOTREL) 5-40 MG capsule, Take 1 capsule by mouth daily., Disp: 90 capsule, Rfl: 2   atorvastatin (LIPITOR) 20 MG tablet, Take 1 tablet (20 mg  total) by mouth daily., Disp: 90 tablet, Rfl: 1   hydrochlorothiazide (HYDRODIURIL) 25 MG tablet, Take 1 tablet (25 mg total) by mouth daily., Disp: 90 tablet, Rfl: 2   No Known Allergies   Review of Systems  Constitutional: Negative.   Eyes:  Negative for blurred vision.  Respiratory: Negative.  Negative for shortness of breath.   Cardiovascular: Negative.  Negative for chest pain and palpitations.  Gastrointestinal: Negative.   Neurological: Negative.   Psychiatric/Behavioral: Negative.      Today's Vitals   06/10/20 0922  BP: 120/88  Pulse: 90  Temp: 98.1 F (36.7 C)  Weight: 213 lb 3.2 oz (96.7 kg)  Height: 5' 3.2" (1.605 m)  PainSc: 2   PainLoc: Head   Body mass index is 37.53 kg/m.  Wt Readings from Last 3 Encounters:  06/10/20 213 lb 3.2 oz (96.7 kg)  02/17/20 210 lb (95.3 kg)  12/23/19 206 lb 12.8 oz (93.8 kg)    BP Readings from Last 3 Encounters:  06/10/20 120/88  02/17/20 140/86  12/23/19 128/80    Objective:  Physical Exam Vitals and nursing note reviewed.  Constitutional:      Appearance: Normal appearance.  HENT:     Head: Normocephalic and atraumatic.     Nose:     Comments: Masked     Mouth/Throat:     Comments: Masked  Cardiovascular:     Rate and Rhythm: Normal rate and regular rhythm.     Heart sounds:  Normal heart sounds.  Pulmonary:     Effort: Pulmonary effort is normal.     Breath sounds: Normal breath sounds.  Skin:    General: Skin is warm.  Neurological:     General: No focal deficit present.     Mental Status: She is alert.  Psychiatric:        Mood and Affect: Mood normal.        Behavior: Behavior normal.        Assessment And Plan:     1. Essential hypertension, benign Comments: Chronic, fair control. She is aware of diastolic elevation.  She is encouraged to incorporate more exercise into her daily routine. F/u in Dec 2022 for CPE.  - CMP14+EGFR  2. Pure hypercholesterolemia Comments: Chronic, she is on statin  therapy. Advised to limit her intake of fried foods.   3. Class 2 severe obesity due to excess calories with serious comorbidity and body mass index (BMI) of 37.0 to 37.9 in adult [facility]) Comments: She is encouraged to strive for BMI less than 30 to decrease cardiac risk. Advised to aim for at least 150 minutes of exercise per week.   4. Immunization due Comments: She declines Shingrix vaccine.    Patient was given opportunity to ask questions. Patient verbalized understanding of the plan and was able to repeat key elements of the plan. All questions were answered to their satisfaction.   I, [name], MD, have reviewed all documentation for this visit. The documentation on 06/10/20 for the exam, diagnosis, procedures, and orders are all accurate and complete.   IF YOU HAVE BEEN REFERRED TO A SPECIALIST, IT MAY TAKE 1-2 WEEKS TO SCHEDULE/PROCESS THE REFERRAL. IF YOU HAVE NOT HEARD FROM US/SPECIALIST IN TWO WEEKS, PLEASE GIVE [place] A CALL AT [phone].   THE PATIENT IS ENCOURAGED TO PRACTICE SOCIAL DISTANCING DUE TO THE COVID-19 PANDEMIC.

## 2020-06-10 NOTE — Patient Instructions (Signed)

## 2020-12-16 ENCOUNTER — Ambulatory Visit (INDEPENDENT_AMBULATORY_CARE_PROVIDER_SITE_OTHER): Payer: 59 | Admitting: Internal Medicine

## 2020-12-16 ENCOUNTER — Encounter: Payer: Self-pay | Admitting: Internal Medicine

## 2020-12-16 ENCOUNTER — Other Ambulatory Visit: Payer: Self-pay

## 2020-12-16 VITALS — BP 132/78 | HR 92 | Temp 98.6°F | Ht 62.0 in | Wt 213.2 lb

## 2020-12-16 DIAGNOSIS — R8290 Unspecified abnormal findings in urine: Secondary | ICD-10-CM

## 2020-12-16 DIAGNOSIS — Z0000 Encounter for general adult medical examination without abnormal findings: Secondary | ICD-10-CM | POA: Diagnosis not present

## 2020-12-16 DIAGNOSIS — Z6838 Body mass index (BMI) 38.0-38.9, adult: Secondary | ICD-10-CM | POA: Diagnosis not present

## 2020-12-16 DIAGNOSIS — R102 Pelvic and perineal pain: Secondary | ICD-10-CM | POA: Diagnosis not present

## 2020-12-16 DIAGNOSIS — Z2821 Immunization not carried out because of patient refusal: Secondary | ICD-10-CM | POA: Diagnosis not present

## 2020-12-16 DIAGNOSIS — I10 Essential (primary) hypertension: Secondary | ICD-10-CM | POA: Diagnosis not present

## 2020-12-16 LAB — POCT URINALYSIS DIPSTICK
Bilirubin, UA: NEGATIVE
Blood, UA: NEGATIVE
Glucose, UA: NEGATIVE
Ketones, UA: NEGATIVE
Nitrite, UA: NEGATIVE
Protein, UA: NEGATIVE
Spec Grav, UA: 1.025 (ref 1.010–1.025)
Urobilinogen, UA: 0.2 E.U./dL
pH, UA: 5.5 (ref 5.0–8.0)

## 2020-12-16 LAB — POCT UA - MICROALBUMIN
Albumin/Creatinine Ratio, Urine, POC: <30
Creatinine, POC: 300 mg/dL
Microalbumin Ur, POC: 30 mg/L

## 2020-12-16 MED ORDER — FAMOTIDINE 20 MG PO TABS: 20.0000 mg | ORAL_TABLET | Freq: Two times a day (BID) | ORAL | 1 refills | Status: DC

## 2020-12-16 NOTE — Patient Instructions (Signed)

## 2020-12-16 NOTE — Progress Notes (Addendum)
I,[name],acting as a Education administrator for [name], MD.,have documented all relevant documentation on the behalf of [name], MD,as directed by  [name], MD while in the presence of [name], MD.  This visit occurred during the SARS-CoV-2 public health emergency.  Safety protocols were in place, including screening questions prior to the visit, additional usage of staff PPE, and extensive cleaning of exam room while observing appropriate contact time as indicated for disinfecting solutions.  Subjective:     Patient ID: Carolyn Baker , female    DOB: 20-Aug-1955 , 65 y.o.   MRN: [id number]   Chief Complaint  Patient presents with   Annual Exam   Hypertension     HPI  She is here today for a full physical examination.  She is no longer followed by GYN.  She is s/p hysterectomy. She reports compliance with meds.  She denies headaches, chest pain and shortness of breath.   Hypertension This is a chronic problem. The current episode started more than 1 year ago. The problem has been gradually improving since onset. The problem is controlled. Pertinent negatives include no blurred vision, chest pain, palpitations or shortness of breath. The current treatment provides moderate improvement. Compliance problems include exercise.     Past Medical History:  Diagnosis Date   Hypertension      Family History  Problem Relation Age of Onset   Stroke Mother    Dementia Mother    Prostate cancer Father    Breast cancer Maternal Aunt      Current Outpatient Medications:    famotidine (PEPCID) 20 MG tablet, Take 1 tablet (20 mg total) by mouth 2 (two) times daily., Disp: 60 tablet, Rfl: 1   amLODipine-benazepril (LOTREL) 5-40 MG capsule, Take 1 capsule by mouth daily., Disp: 90 capsule, Rfl: 2   atorvastatin (LIPITOR) 20 MG tablet, Take 1 tablet (20 mg total) by mouth daily., Disp: 90 tablet, Rfl: 1   Cholecalciferol (VITAMIN D3) 125 MCG (5000 UT) CAPS, Take by  mouth., Disp: , Rfl:    hydrochlorothiazide (HYDRODIURIL) 25 MG tablet, Take 1 tablet (25 mg total) by mouth daily., Disp: 90 tablet, Rfl: 2   nystatin cream (MYCOSTATIN), Apply 1 application topically 2 (two) times daily. Please add use as needed, Disp: 45 g, Rfl: 1   No Known Allergies    The patient states she uses status post hysterectomy for birth control. Last LMP was No LMP recorded. Patient has had a hysterectomy.. Negative for Dysmenorrhea. Negative for: breast discharge, breast lump(s), breast pain and breast self exam. Associated symptoms include abnormal vaginal bleeding. Pertinent negatives include abnormal bleeding (hematology), anxiety, decreased libido, depression, difficulty falling sleep, dyspareunia, history of infertility, nocturia, sexual dysfunction, sleep disturbances, urinary incontinence, urinary urgency, vaginal discharge and vaginal itching. Diet regular.The patient states her exercise level is  minimal.  . The patient's tobacco use is:  Social History   Tobacco Use  Smoking Status Never  Smokeless Tobacco Never  . She has been exposed to passive smoke. The patient's alcohol use is:  Social History   Substance and Sexual Activity  Alcohol Use Yes   Comment: rarely   Review of Systems  Constitutional: Negative.   HENT: Negative.    Eyes: Negative.  Negative for blurred vision.  Respiratory: Negative.  Negative for shortness of breath.   Cardiovascular: Negative.  Negative for chest pain and palpitations.  Gastrointestinal:  Positive for abdominal distention and abdominal pain.  She c/o left sided abdominal pain. Admits excessive flatulence, bloating and some belching.   Endocrine: Negative.   Genitourinary: Negative.   Musculoskeletal: Negative.   Skin: Negative.   Allergic/Immunologic: Negative.   Neurological: Negative.   Hematological: Negative.   Psychiatric/Behavioral: Negative.      Today's Vitals   12/16/20 1147  BP: 132/78  Pulse: 92   Temp: 98.6 F (37 C)  Weight: 213 lb 3.2 oz (96.7 kg)  Height: 5' 2" (1.575 m)   Body mass index is 38.99 kg/m.  Wt Readings from Last 3 Encounters:  12/16/20 213 lb 3.2 oz (96.7 kg)  06/10/20 213 lb 3.2 oz (96.7 kg)  02/17/20 210 lb (95.3 kg)    BP Readings from Last 3 Encounters:  12/16/20 132/78  06/10/20 120/88  02/17/20 140/86    Objective:  Physical Exam Vitals and nursing note reviewed.  Constitutional:      Appearance: Normal appearance.  HENT:     Head: Normocephalic and atraumatic.     Right Ear: Tympanic membrane, ear canal and external ear normal.     Left Ear: Tympanic membrane, ear canal and external ear normal.     Nose:     Comments: Masked     Mouth/Throat:     Comments: Masked  Eyes:     Extraocular Movements: Extraocular movements intact.     Conjunctiva/sclera: Conjunctivae normal.     Pupils: Pupils are equal, round, and reactive to light.  Cardiovascular:     Rate and Rhythm: Normal rate and regular rhythm.     Pulses: Normal pulses.     Heart sounds: Normal heart sounds.  Pulmonary:     Effort: Pulmonary effort is normal.     Breath sounds: Normal breath sounds.  Chest:  Breasts:    Tanner Score is 5.     Right: Normal.     Left: Normal.  Abdominal:     General: Bowel sounds are normal.     Palpations: Abdomen is soft.     Comments: Obese, soft, LLQ tenderness  Genitourinary:    Comments: deferred Musculoskeletal:        General: Normal range of motion.     Cervical back: Normal range of motion and neck supple.  Skin:    General: Skin is warm and dry.  Neurological:     General: No focal deficit present.     Mental Status: She is alert and oriented to person, place, and time.  Psychiatric:        Mood and Affect: Mood normal.        Behavior: Behavior normal.        Assessment And Plan:     1. Routine general medical examination at health care facility Comments: A full exam was performed. Importance of monthly self breast  exams was discussed with the patient. PATIENT IS ADVISED TO GET 30-45 MINUTES REGULAR EXERCISE NO LESS THAN FOUR TO FIVE DAYS PER WEEK - BOTH WEIGHTBEARING EXERCISES AND AEROBIC ARE RECOMMENDED.  PATIENT IS ADVISED TO FOLLOW A HEALTHY DIET WITH AT LEAST SIX FRUITS/VEGGIES PER DAY, DECREASE INTAKE OF RED MEAT, AND TO INCREASE FISH INTAKE TO TWO DAYS PER WEEK.  MEATS/FISH SHOULD NOT BE FRIED, BAKED OR BROILED IS PREFERABLE.  IT IS ALSO IMPORTANT TO CUT BACK ON YOUR SUGAR INTAKE. PLEASE AVOID ANYTHING WITH ADDED SUGAR, CORN SYRUP OR OTHER SWEETENERS. IF YOU MUST USE A SWEETENER, YOU CAN TRY STEVIA. IT IS ALSO IMPORTANT TO AVOID ARTIFICIALLY SWEETENERS AND DIET BEVERAGES.  LASTLY, I SUGGEST WEARING SPF 50 SUNSCREEN ON EXPOSED PARTS AND ESPECIALLY WHEN IN THE DIRECT SUNLIGHT FOR AN EXTENDED PERIOD OF TIME.  PLEASE AVOID FAST FOOD RESTAURANTS AND INCREASE YOUR WATER INTAKE.  - CMP14+EGFR - CBC - Lipid panel - Hemoglobin A1c  2. Essential hypertension, benign Comments: Chronic, fair control. Goal BP<130/80. EKG performed, NSR w/o acute changes. No med changes today. She is encouraged to avoid adding salt to her foods. She will f/u in three to four  months for re-evaluation. - POCT Urinalysis Dipstick (81002) - POCT UA - Microalbumin - EKG 12-Lead  3. Pelvic pain She is s/p PARTIAL hysterectomy per history. I will schedule her for pelvic/transvaginal u/s for further evaluation. - US Pelvic Complete With Transvaginal; Future  4. Abnormal urine - Culture, Urine  5. Class 2 severe obesity due to excess calories with serious comorbidity and body mass index (BMI) of 38.0 to 38.9 in adult [facility]) Comments: She is encouraged to strive for BMI less than 30 to decrease cardiac risk. Advised to aim for at least 150 minutes of exercise per week.   6. Influenza vaccination declined  7. COVID-19 vaccination declined  Patient was given opportunity to ask questions. Patient verbalized understanding of the plan and  was able to repeat key elements of the plan. All questions were answered to their satisfaction.   I, [name], MD, have reviewed all documentation for this visit. The documentation on 12/16/20 for the exam, diagnosis, procedures, and orders are all accurate and complete.   THE PATIENT IS ENCOURAGED TO PRACTICE SOCIAL DISTANCING DUE TO THE COVID-19 PANDEMIC.

## 2020-12-17 LAB — CMP14+EGFR
ALT: 30 IU/L (ref 0–32)
AST: 20 IU/L (ref 0–40)
Albumin/Globulin Ratio: 1.4 (ref 1.2–2.2)
Albumin: 4.6 g/dL (ref 3.8–4.8)
Alkaline Phosphatase: 56 IU/L (ref 44–121)
BUN/Creatinine Ratio: 17 (ref 12–28)
BUN: 17 mg/dL (ref 8–27)
Bilirubin Total: 0.5 mg/dL (ref 0.0–1.2)
CO2: 23 mmol/L (ref 20–29)
Calcium: 9.8 mg/dL (ref 8.7–10.3)
Chloride: 106 mmol/L (ref 96–106)
Creatinine, Ser: 1.01 mg/dL — ABNORMAL HIGH (ref 0.57–1.00)
Globulin, Total: 3.2 g/dL (ref 1.5–4.5)
Glucose: 87 mg/dL (ref 70–99)
Potassium: 4.6 mmol/L (ref 3.5–5.2)
Sodium: 143 mmol/L (ref 134–144)
Total Protein: 7.8 g/dL (ref 6.0–8.5)
eGFR: 62 mL/min/{1.73_m2} (ref >59)

## 2020-12-17 LAB — LIPID PANEL
Chol/HDL Ratio: 4 ratio (ref 0.0–4.4)
Cholesterol, Total: 210 mg/dL — ABNORMAL HIGH (ref 100–199)
HDL: 52 mg/dL (ref >39)
LDL Chol Calc (NIH): 145 mg/dL — ABNORMAL HIGH (ref 0–99)
Triglycerides: 71 mg/dL (ref 0–149)
VLDL Cholesterol Cal: 13 mg/dL (ref 5–40)

## 2020-12-17 LAB — HEMOGLOBIN A1C
Est. average glucose Bld gHb Est-mCnc: 120 mg/dL
Hgb A1c MFr Bld: 5.8 % — ABNORMAL HIGH (ref 4.8–5.6)

## 2020-12-17 LAB — CBC
Hematocrit: 45.6 % (ref 34.0–46.6)
Hemoglobin: 15.1 g/dL (ref 11.1–15.9)
MCH: 31.5 pg (ref 26.6–33.0)
MCHC: 33.1 g/dL (ref 31.5–35.7)
MCV: 95 fL (ref 79–97)
Platelets: 203 10*3/uL (ref 150–450)
RBC: 4.79 x10E6/uL (ref 3.77–5.28)
RDW: 12.8 % (ref 11.7–15.4)
WBC: 4.6 10*3/uL (ref 3.4–10.8)

## 2020-12-21 LAB — URINE CULTURE

## 2020-12-23 ENCOUNTER — Telehealth: Payer: Self-pay

## 2020-12-23 NOTE — Telephone Encounter (Signed)
Patient notified of lab results. YL,RMA 

## 2020-12-30 ENCOUNTER — Ambulatory Visit: Payer: 59

## 2021-01-06 ENCOUNTER — Other Ambulatory Visit: Payer: Self-pay

## 2021-01-06 ENCOUNTER — Ambulatory Visit
Admission: RE | Admit: 2021-01-06 | Discharge: 2021-01-06 | Disposition: A | Discharge status: Home / Self Care | Payer: 59 | Source: Ambulatory Visit | Attending: Internal Medicine | Admitting: Internal Medicine

## 2021-01-06 DIAGNOSIS — R102 Pelvic and perineal pain: Secondary | ICD-10-CM | POA: Insufficient documentation

## 2021-01-08 ENCOUNTER — Other Ambulatory Visit: Payer: Self-pay | Admitting: Internal Medicine

## 2021-01-24 ENCOUNTER — Other Ambulatory Visit: Payer: Self-pay | Admitting: Internal Medicine

## 2021-02-10 ENCOUNTER — Other Ambulatory Visit: Payer: Self-pay

## 2021-02-10 ENCOUNTER — Encounter: Payer: Self-pay | Admitting: Internal Medicine

## 2021-02-10 ENCOUNTER — Ambulatory Visit (INDEPENDENT_AMBULATORY_CARE_PROVIDER_SITE_OTHER): Payer: 59 | Admitting: Internal Medicine

## 2021-02-10 VITALS — BP 142/100 | HR 96 | Temp 98.3°F | Ht 63.4 in | Wt 215.0 lb

## 2021-02-10 DIAGNOSIS — R142 Eructation: Secondary | ICD-10-CM

## 2021-02-10 DIAGNOSIS — Z1211 Encounter for screening for malignant neoplasm of colon: Secondary | ICD-10-CM

## 2021-02-10 DIAGNOSIS — I10 Essential (primary) hypertension: Secondary | ICD-10-CM | POA: Diagnosis not present

## 2021-02-10 DIAGNOSIS — M79609 Pain in unspecified limb: Secondary | ICD-10-CM

## 2021-02-10 DIAGNOSIS — K5909 Other constipation: Secondary | ICD-10-CM

## 2021-02-10 DIAGNOSIS — R202 Paresthesia of skin: Secondary | ICD-10-CM | POA: Diagnosis not present

## 2021-02-10 DIAGNOSIS — G8929 Other chronic pain: Secondary | ICD-10-CM

## 2021-02-10 DIAGNOSIS — Z6837 Body mass index (BMI) 37.0-37.9, adult: Secondary | ICD-10-CM

## 2021-02-10 MED ORDER — AMLODIPINE BESYLATE 2.5 MG PO TABS: ORAL_TABLET | ORAL | 11 refills | Status: DC

## 2021-02-10 NOTE — Progress Notes (Signed)
[name],acting as a Education administrator for [name], MD.,have documented all relevant documentation on the behalf of [name], MD,as directed by  [name], MD while in the presence of [name], MD.  This visit occurred during the SARS-CoV-2 public health emergency.  Safety protocols were in place, including screening questions prior to the visit, additional usage of staff PPE, and extensive cleaning of exam room while observing appropriate contact time as indicated for disinfecting solutions.  Subjective:     Patient ID: Carolyn Baker , female    DOB: [birth year] , 66 y.o.   MRN: [id number]   No chief complaint on file.   HPI  Patient presents today for a 6 week f/u on her pelvic pain. She had pelvic ultrasound which was negative for any acute issues. This has since resolved.    At her last visit, she was also having belching which the famotidine has helped. Now,she is having left lower back pain that radiates to her left leg.  She denies LLE weakness. She does admit to LLE paresthesias.    Regarding her bp, states her readings at home are "good". Reports readings in 130s/80s. Admits she is eating too much pork and not getting enough exercise.   Hypertension This is a chronic problem. The current episode started more than 1 year ago. The problem has been gradually improving since onset. The problem is controlled. Pertinent negatives include no blurred vision, chest pain, palpitations or shortness of breath. The current treatment provides moderate improvement. Compliance problems include exercise.     Past Medical History:  Diagnosis Date   Hypertension      Family History  Problem Relation Age of Onset   Stroke Mother    Dementia Mother    Prostate cancer Father    Breast cancer Maternal Aunt      Current Outpatient Medications:    amLODipine (NORVASC) 2.5 MG tablet, Take one tab nightly., Disp: 30 tablet, Rfl: 11   amLODipine-benazepril (LOTREL)  5-40 MG capsule, Take 1 capsule by mouth daily., Disp: 90 capsule, Rfl: 2   atorvastatin (LIPITOR) 20 MG tablet, Take 1 tablet (20 mg total) by mouth daily., Disp: 90 tablet, Rfl: 1   Cholecalciferol (VITAMIN D3) 125 MCG (5000 UT) CAPS, Take by mouth., Disp: , Rfl:    famotidine (PEPCID) 20 MG tablet, TAKE 1 TABLET BY MOUTH TWICE A DAY, Disp: 180 tablet, Rfl: 1   hydrochlorothiazide (HYDRODIURIL) 25 MG tablet, Take 1 tablet (25 mg total) by mouth daily., Disp: 90 tablet, Rfl: 2   nystatin cream (MYCOSTATIN), Apply 1 application topically 2 (two) times daily. Please add use as needed, Disp: 45 g, Rfl: 1   No Known Allergies   Review of Systems  Constitutional: Negative.   Eyes:  Negative for blurred vision.  Respiratory: Negative.  Negative for shortness of breath.   Cardiovascular: Negative.  Negative for chest pain and palpitations.  Gastrointestinal: Negative.   Neurological:  Positive for numbness.  Psychiatric/Behavioral: Negative.      Today's Vitals   02/10/21 0940 02/10/21 0957  BP: (!) 150/100 (!) 142/100  Pulse: 96   Temp: 98.3 F (36.8 C)   Weight: 215 lb (97.5 kg)   Height: 5' 3.4" (1.61 m)   PainSc: 0-No pain    Body mass index is 37.61 kg/m.  Wt Readings from Last 3 Encounters:  02/10/21 215 lb (97.5 kg)  12/16/20 213 lb 3.2 oz (96.7 kg)  06/10/20 213 lb 3.2 oz (  96.7 kg)    BP Readings from Last 3 Encounters:  02/10/21 (!) 142/100  12/16/20 132/78  06/10/20 120/88     Objective:  Physical Exam Vitals and nursing note reviewed.  Constitutional:      Appearance: Normal appearance.  HENT:     Head: Normocephalic and atraumatic.     Nose:     Comments: Masked     Mouth/Throat:     Comments: Masked  Eyes:     Extraocular Movements: Extraocular movements intact.  Cardiovascular:     Rate and Rhythm: Normal rate and regular rhythm.     Heart sounds: Normal heart sounds.  Pulmonary:     Effort: Pulmonary effort is normal.     Breath sounds: Normal  breath sounds.  Musculoskeletal:        General: No tenderness.     Cervical back: Normal range of motion.     Right lower leg: No edema.     Left lower leg: No edema.  Skin:    General: Skin is warm.  Neurological:     General: No focal deficit present.     Mental Status: She is alert.  Psychiatric:        Mood and Affect: Mood normal.        Behavior: Behavior normal.        Assessment And Plan:     1. Belching Comments: Improved with use of famotidine. Reminded to stop eating 3 hours prior to going to bed/lying down.   2. Paresthesia and pain of left extremity Comments: Possibly due to lumbar disc disease, will check vitamin B12 level today.  - Vitamin B12  3. Essential hypertension, benign Comments: Uncontrolled. I will c/w benazepril/amlodipine and hctz in am, I will add amlodipine 2.5mg  nightly. Adviised to cut back on her salt intake.  4. Chronic constipation Comments: Advised to increase water intake, daily activity and consider Miralax. Pt advised this can cause back pain as well. I will also refer her to GI.   5. Class 2 severe obesity due to excess calories with serious comorbidity and body mass index (BMI) of 37.0 to 37.9 in adult [facility]) Comments: She is encouraged to incorporate more exercise into her daily routine.   6. Screen for colon cancer Comments: I will refer her to GI for CRC screening. Repeat colonoscopy due based on h/o polyps. Last procedure performed 2020.  - Ambulatory referral to Gastroenterology   Patient was given opportunity to ask questions. Patient verbalized understanding of the plan and was able to repeat key elements of the plan. All questions were answered to their satisfaction.   I, [name], MD, have reviewed all documentation for this visit. The documentation on 02/12/21 for the exam, diagnosis, procedures, and orders are all accurate and complete.   IF YOU HAVE BEEN REFERRED TO A SPECIALIST, IT MAY TAKE 1-2 WEEKS TO  SCHEDULE/PROCESS THE REFERRAL. IF YOU HAVE NOT HEARD FROM US/SPECIALIST IN TWO WEEKS, PLEASE GIVE [place] A CALL AT [phone].   THE PATIENT IS ENCOURAGED TO PRACTICE SOCIAL DISTANCING DUE TO THE COVID-19 PANDEMIC.

## 2021-02-10 NOTE — Patient Instructions (Addendum)
PELVIC FLOOR EXERCISES  HIP OPENING EXERCISES   Chronic Back Pain When back pain lasts longer than 3 months, it is called chronic back pain. Pain may get worse at certain times (flare-ups). There are things you can do at home to manage your pain. Follow these instructions at home: Pay attention to any changes in your symptoms. Take these actions to help with your pain: Managing pain and stiffness   If told, put ice on the painful area. Your doctor may tell you to use ice for 24-48 hours after the flare-up starts. To do this: Put ice in a plastic bag. Place a towel between your skin and the bag. Leave the ice on for 20 minutes, 2-3 times a day. If told, put heat on the painful area. Do this as often as told by your doctor. Use the heat source that your doctor recommends, such as a moist heat pack or a heating pad. Place a towel between your skin and the heat source. Leave the heat on for 20-30 minutes. Take off the heat if your skin turns bright red. This is especially important if you are unable to feel pain, heat, or cold. You may have a greater risk of getting burned. Soak in a warm bath. This can help relieve pain. Activity  Avoid bending and other activities that make pain worse. When standing: Keep your upper back and neck straight. Keep your shoulders pulled back. Avoid slouching. When sitting: Keep your back straight. Relax your shoulders. Do not round your shoulders or pull them backward. Do not sit or stand in one place for long periods of time. Take short rest breaks during the day. Lying down or standing is usually better than sitting. Resting can help relieve pain. When sitting or lying down for a long time, do some mild activity or stretching. This will help to prevent stiffness and pain. Get regular exercise. Ask your doctor what activities are safe for you. Do not lift anything that is heavier than 10 lb (4.5 kg) or the limit that you are told, until your doctor says  that it is safe. To prevent injury when you lift things: Bend your knees. Keep the weight close to your body. Avoid twisting. Sleep on a firm mattress. Try lying on your side with your knees slightly bent. If you lie on your back, put a pillow under your knees. Medicines Treatment may include medicines for pain and swelling taken by mouth or put on the skin, prescription pain medicine, or muscle relaxants. Take over-the-counter and prescription medicines only as told by your doctor. Ask your doctor if the medicine prescribed to you: Requires you to avoid driving or using machinery. Can cause trouble pooping (constipation). You may need to take these actions to prevent or treat trouble pooping: Drink enough fluid to keep your pee (urine) pale yellow. Take over-the-counter or prescription medicines. Eat foods that are high in fiber. These include beans, whole grains, and fresh fruits and vegetables. Limit foods that are high in fat and sugars. These include fried or sweet foods. General instructions Do not use any products that contain nicotine or tobacco, such as cigarettes, e-cigarettes, and chewing tobacco. If you need help quitting, ask your doctor. Keep all follow-up visits as told by your doctor. This is important. Contact a doctor if: Your pain does not get better with rest or medicine. Your pain gets worse, or you have new pain. You have a high fever. You lose weight very quickly. You have trouble doing  your normal activities. Get help right away if: One or both of your legs or feet feel weak. One or both of your legs or feet lose feeling (have numbness). You have trouble controlling when you poop (have a bowel movement) or pee (urinate). You have bad back pain and: You feel like you may vomit (nauseous), or you vomit. You have pain in your belly (abdomen). You have shortness of breath. You faint. Summary When back pain lasts longer than 3 months, it is called chronic back  pain. Pain may get worse at certain times (flare-ups). Use ice and heat as told by your doctor. Your doctor may tell you to use ice after flare-ups. This information is not intended to replace advice given to you by your health care provider. Make sure you discuss any questions you have with your health care provider. Document Revised: 01/29/2019 Document Reviewed: 01/29/2019 Elsevier Patient Education  2022 [employer].

## 2021-02-11 LAB — VITAMIN B12: Vitamin B-12: 239 pg/mL (ref 232–1245)

## 2021-02-22 ENCOUNTER — Ambulatory Visit: Payer: 59

## 2021-02-22 ENCOUNTER — Other Ambulatory Visit: Payer: Self-pay

## 2021-02-22 VITALS — BP 128/98 | HR 102 | Temp 98.8°F | Ht 63.0 in | Wt 209.0 lb

## 2021-02-22 DIAGNOSIS — I10 Essential (primary) hypertension: Secondary | ICD-10-CM

## 2021-02-22 MED ORDER — AMLODIPINE BESYLATE 2.5 MG PO TABS: ORAL_TABLET | ORAL | 11 refills | Status: DC

## 2021-02-22 NOTE — Progress Notes (Signed)
Pt presents today for BPC. reports going on regular walks, she did catch a cold reports not having Covid, took a test. She says the cold has improved, she drinks theraflu at the moment which does help.  Pt takes 5-40 amlodipine in the morning, amlodipine 2.5 taken at night. She reports taking med before appointment this morning. Denies headache, chest pain, blurred vision, SOB. BP Readings from Last 3 Encounters:  02/22/21 (!) 128/98  02/10/21 (!) 142/100  12/16/20 132/78   Provider would like her to increase 2.5 to 2 tablets a night instead of one. Pt will f/u with provider on 03/28/2021.

## 2021-03-28 ENCOUNTER — Other Ambulatory Visit: Payer: Self-pay

## 2021-03-28 ENCOUNTER — Encounter: Payer: Self-pay | Admitting: Internal Medicine

## 2021-03-28 ENCOUNTER — Ambulatory Visit: Payer: 59 | Admitting: Internal Medicine

## 2021-03-28 VITALS — BP 138/90 | HR 98 | Temp 98.6°F | Ht 63.0 in | Wt 207.0 lb

## 2021-03-28 DIAGNOSIS — Z860100 Personal history of colon polyps, unspecified: Secondary | ICD-10-CM

## 2021-03-28 DIAGNOSIS — E66812 Obesity, class 2: Secondary | ICD-10-CM

## 2021-03-28 DIAGNOSIS — I10 Essential (primary) hypertension: Secondary | ICD-10-CM | POA: Diagnosis not present

## 2021-03-28 DIAGNOSIS — Z2821 Immunization not carried out because of patient refusal: Secondary | ICD-10-CM

## 2021-03-28 DIAGNOSIS — Z6836 Body mass index (BMI) 36.0-36.9, adult: Secondary | ICD-10-CM | POA: Diagnosis not present

## 2021-03-28 MED ORDER — METOPROLOL SUCCINATE ER 25 MG PO TB24: ORAL_TABLET | ORAL | 11 refills | Status: DC

## 2021-03-28 NOTE — Progress Notes (Signed)
?Carolyn Baker,acting as a Education administrator for Carolyn Greenland, MD.,have documented all relevant documentation on the behalf of Carolyn Greenland, MD,as directed by  Carolyn Greenland, MD while in the presence of Carolyn Greenland, MD.  ?This visit occurred during the SARS-CoV-2 public health emergency.  Safety protocols were in place, including screening questions prior to the visit, additional usage of staff PPE, and extensive cleaning of exam room while observing appropriate contact time as indicated for disinfecting solutions. ? ?Subjective:  ?  ? Patient ID: Carolyn Baker , female    DOB: 1955/08/28 , 66 y.o.   MRN: [id number] ? ? ?Chief Complaint  ?Patient presents with  ? Hypertension  ? ? ?HPI ? ?Patient presents today for a 6 week BP check. At her last visit, the nighttime dose of amlodipine was increased to '5mg'$ . She states it caused her mouth to "feel funny" and she didn't feel "normal'.  She is back to taking 2.'5mg'$  amlodipine daily, no longer taking at night.  ? ?Hypertension ?This is a chronic problem. The current episode started more than 1 year ago. The problem has been gradually improving since onset. The problem is controlled. Pertinent negatives include no blurred vision, chest pain, palpitations or shortness of breath. The current treatment provides moderate improvement. Compliance problems include exercise.    ? ?Past Medical History:  ?Diagnosis Date  ? Hypertension   ?  ? ?Family History  ?Problem Relation Age of Onset  ? Stroke Mother   ? Dementia Mother   ? Prostate cancer Father   ? Breast cancer Maternal Aunt   ? ? ? ?Current Outpatient Medications:  ?  amLODipine-benazepril (LOTREL) 5-40 MG capsule, Take 1 capsule by mouth daily., Disp: 90 capsule, Rfl: 2 ?  atorvastatin (LIPITOR) 20 MG tablet, Take 1 tablet (20 mg total) by mouth daily., Disp: 90 tablet, Rfl: 1 ?  Cholecalciferol (VITAMIN D3) 125 MCG (5000 UT) CAPS, Take by mouth., Disp: , Rfl:  ?  famotidine (PEPCID) 20 MG tablet, TAKE 1  TABLET BY MOUTH TWICE A DAY, Disp: 180 tablet, Rfl: 1 ?  hydrochlorothiazide (HYDRODIURIL) 25 MG tablet, Take 1 tablet (25 mg total) by mouth daily., Disp: 90 tablet, Rfl: 2 ?  nystatin cream (MYCOSTATIN), Apply 1 application topically 2 (two) times daily. Please add use as needed, Disp: 45 g, Rfl: 1  ? ?No Known Allergies  ? ?Review of Systems  ?Constitutional: Negative.   ?Eyes:  Negative for blurred vision.  ?Respiratory: Negative.  Negative for shortness of breath.   ?Cardiovascular: Negative.  Negative for chest pain and palpitations.  ?Gastrointestinal: Negative.   ?Neurological: Negative.   ?Psychiatric/Behavioral: Negative.     ? ?Today's Vitals  ? 03/28/21 1513  ?BP: 138/90  ?Pulse: 98  ?Temp: 98.6 ?F (37 ?C)  ?Weight: 207 lb (93.9 kg)  ?Height: '5\' 3"'$  (1.6 m)  ?PainSc: 0-No pain  ? ?Body mass index is 36.67 kg/m?.  ?Wt Readings from Last 3 Encounters:  ?03/28/21 207 lb (93.9 kg)  ?02/22/21 209 lb (94.8 kg)  ?02/10/21 215 lb (97.5 kg)  ?  ?BP Readings from Last 3 Encounters:  ?03/28/21 138/90  ?02/22/21 (!) 128/98  ?02/10/21 (!) 142/100  ? ? ? ?Objective:  ?Physical Exam ?Vitals and nursing note reviewed.  ?Constitutional:   ?   Appearance: Normal appearance. She is obese.  ?HENT:  ?   Head: Normocephalic and atraumatic.  ?   Nose:  ?   Comments: Masked  ?   Mouth/Throat:  ?  Comments: Masked  ?Eyes:  ?   Extraocular Movements: Extraocular movements intact.  ?Cardiovascular:  ?   Rate and Rhythm: Normal rate and regular rhythm.  ?   Heart sounds: Normal heart sounds.  ?Pulmonary:  ?   Effort: Pulmonary effort is normal.  ?   Breath sounds: Normal breath sounds.  ?Musculoskeletal:  ?   Cervical back: Normal range of motion.  ?Skin: ?   General: Skin is warm.  ?Neurological:  ?   General: No focal deficit present.  ?   Mental Status: She is alert.  ?Psychiatric:     ?   Mood and Affect: Mood normal.     ?   Behavior: Behavior normal.  ?   ?Assessment And Plan:  ?   ?1. Essential hypertension,  benign ?Comments: Chronic, still uncontrolled due to intolerance of amlodipine '5mg'$  nightly. I want to change her to Toprol XL, '25mg'$  1/2 tab daily; however, she wishes to complete her currently supply of amlodipine 2.'5mg'$ . Advised to take with evening  meal at 6pm, NOT MIDNIGHT.  ? ?2. Class 2 severe obesity due to excess calories with serious comorbidity and body mass index (BMI) of 36.0 to 36.9 in adult [facility]) ?Comments: She declined referral to PREP program b/c she does not want to go to [facility].  ? ?3. Personal history of colonic polyps ?Comments: Per colonoscopy report Jan 2020, she is due for colonoscopy due to polyps. Advised by GI that "she is not due yet".  She was advised she is due in 2025.  ? ?4. Herpes zoster vaccination declined ?  ? ?ADDENDUM: At the end of the visit, she decided to start the Toprol XL, 1/2 tab po qpm. Advised to take with evening meal. She will rto in 2 weeks for bp check, then six weeks to see me.  ? ?Patient was given opportunity to ask questions. Patient verbalized understanding of the plan and was able to repeat key elements of the plan. All questions were answered to their satisfaction.  ? ?I, Carolyn Greenland, MD, have reviewed all documentation for this visit. The documentation on 03/28/21 for the exam, diagnosis, procedures, and orders are all accurate and complete.  ? ?IF YOU HAVE BEEN REFERRED TO A SPECIALIST, IT MAY TAKE 1-2 WEEKS TO SCHEDULE/PROCESS THE REFERRAL. IF YOU HAVE NOT HEARD FROM US/SPECIALIST IN TWO WEEKS, PLEASE GIVE [place] A CALL AT [phone] X [phone].  ? ?THE PATIENT IS ENCOURAGED TO PRACTICE SOCIAL DISTANCING DUE TO THE COVID-19 PANDEMIC.   ?

## 2021-03-28 NOTE — Patient Instructions (Signed)

## 2021-04-11 IMAGING — US US BREAST*R* LIMITED INC AXILLA
1 series · 4 of 4 positions shown · non-contrast
Comparison: Previous exam(s).

CLINICAL DATA: 64-year-old female with focal pain in the UPPER
INNER RIGHT breast. Also for annual bilateral mammogram.

EXAM:
DIGITAL DIAGNOSTIC BILATERAL MAMMOGRAM WITH CAD AND TOMO
ULTRASOUND RIGHT BREAST

[Series 1: us breast*right* limited inc axilla · 0.08mm/px · 4 of 4 slices shown]
[im 1/4]
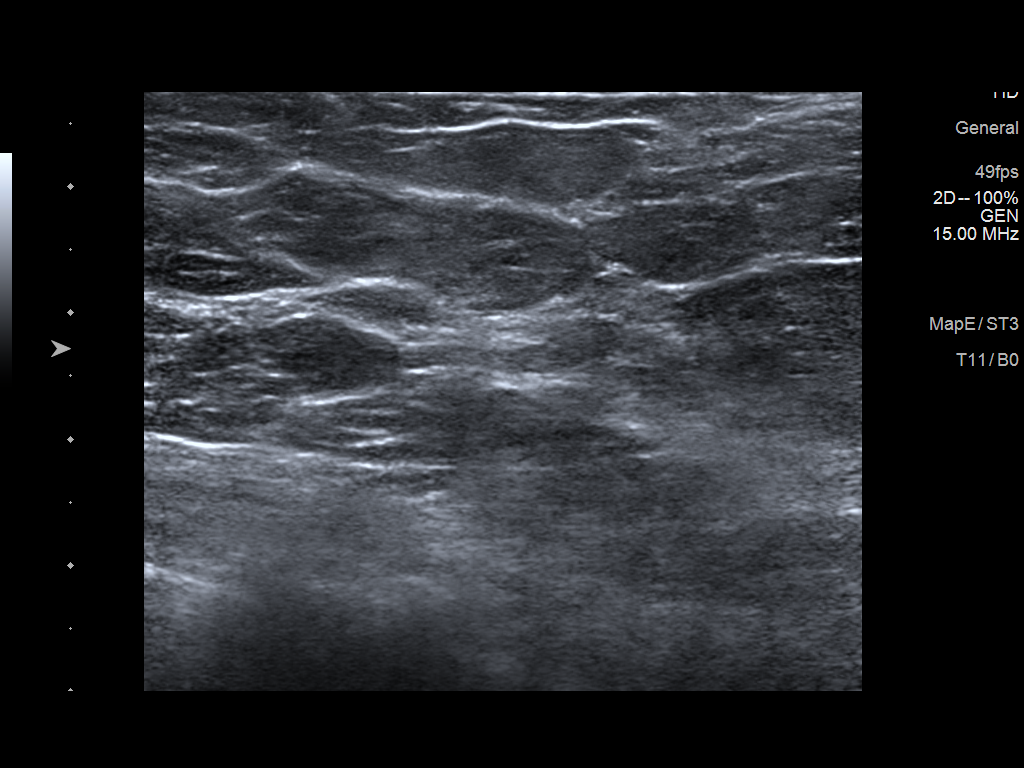
[im 2/4]
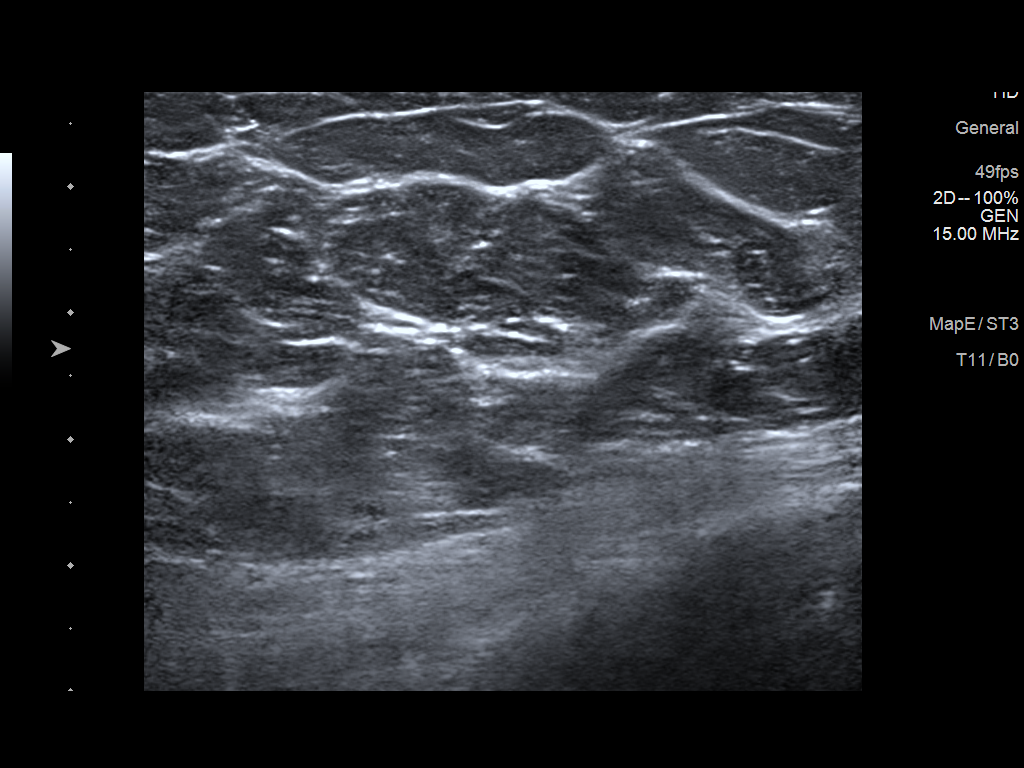
[im 3/4]
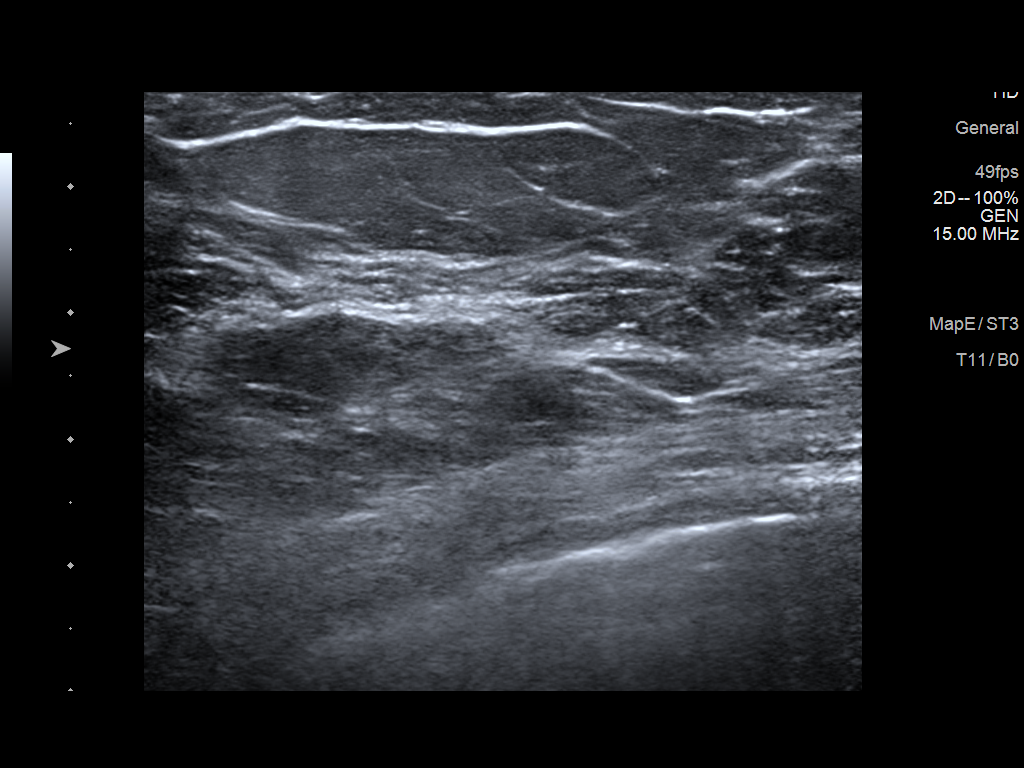
[im 4/4]
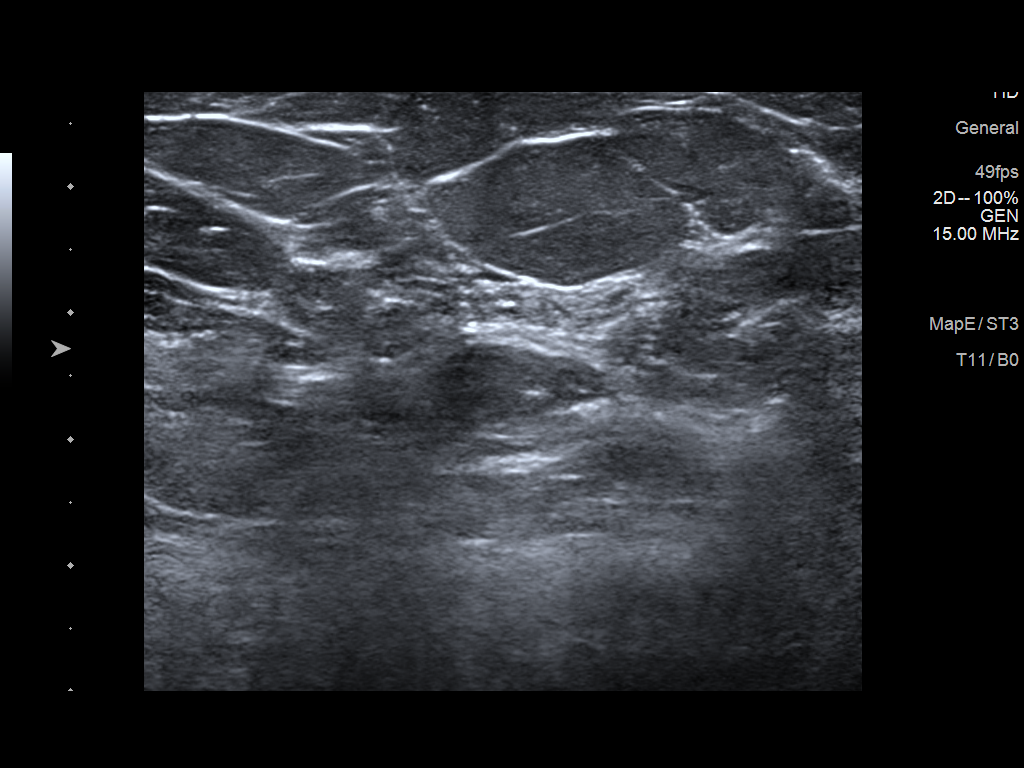

[4 of 4 positions shown; findings below may reference images not displayed]

ACR Breast Density Category b: There are scattered areas of
fibroglandular density.
FINDINGS: 2D/3D full field views of both breasts demonstrate no suspicious
mass, distortion or worrisome calcifications.

Mammographic images were processed with CAD.

Targeted ultrasound of the RIGHT breast is performed, showing no
sonographic abnormality at the site of patient's pain. No solid or
cystic mass, distortion or abnormal shadowing identified.
IMPRESSION: 1. No mammographic or sonographic abnormality within the RIGHT
breast at the site of patient's pain.
2. No mammographic evidence of breast malignancy.

RECOMMENDATION:
Bilateral screening mammogram in 1 year.

I have discussed the findings and recommendations with the patient.
If applicable, a reminder letter will be sent to the patient
regarding the next appointment.

BI-RADS CATEGORY  1: Negative.

## 2021-04-12 ENCOUNTER — Ambulatory Visit: Payer: Medicare Other

## 2021-04-12 VITALS — BP 130/82 | HR 94 | Temp 98.3°F | Ht 63.0 in

## 2021-04-12 DIAGNOSIS — I10 Essential (primary) hypertension: Secondary | ICD-10-CM

## 2021-04-12 NOTE — Progress Notes (Signed)
The patient is here today for a blood pressure check.  The patient was notified that Dr. Baird Cancer said to continue her current medications and to keep next scheduled appointment.  ? ?Wt Readings from Last 3 Encounters:  ?03/28/21 207 lb (93.9 kg)  ?02/22/21 209 lb (94.8 kg)  ?02/10/21 215 lb (97.5 kg)  ? ?BP Readings from Last 3 Encounters:  ?04/12/21 130/82  ?03/28/21 138/90  ?02/22/21 (!) 128/98  ?  ? ?

## 2021-04-12 NOTE — Patient Instructions (Signed)
Cooking With Less Salt Cooking with less salt is one way to reduce the amount of sodium you get from food. Sodium is one of the elements that make up salt. It is found naturally in foods and is also added to certain foods. Depending on your condition and overall health, your health care provider or dietitian may recommend that you reduce your sodium intake. Most people should have less than 2,300 milligrams (mg) of sodium each day. If you have high blood pressure (hypertension), you may need to limit your sodium to 1,500 mg each day. Follow the tipsbelow to help reduce your sodium intake. What are tips for eating less sodium? Reading food labels  Check the food label before buying or using packaged ingredients. Always check the label for the serving size and sodium content. Look for products with no more than 140 mg of sodium in one serving. Check the % Daily Value column to see what percent of the daily recommended amount of sodium is provided in one serving of the product. Foods with 5% or less in this column are considered low in sodium. Foods with 20% or higher are considered high in sodium. Do not choose foods with salt as one of the first three ingredients on the ingredients list. If salt is one of the first three ingredients, it usually means the item is high in sodium.  Shopping Buy sodium-free or low-sodium products. Look for the following words on food labels: Low-sodium. Sodium-free. Reduced-sodium. No salt added. Unsalted. Always check the sodium content even if foods are labeled as low-sodium or no salt added. Buy fresh foods. Cooking Use herbs, seasonings without salt, and spices as substitutes for salt. Use sodium-free baking soda when baking. Grill, braise, or roast foods to add flavor with less salt. Avoid adding salt to pasta, rice, or hot cereals. Drain and rinse canned vegetables, beans, and meat before use. Avoid adding salt when cooking sweets and desserts. Cook with  low-sodium ingredients. What foods are high in sodium? Vegetables Regular canned vegetables (not low-sodium or reduced-sodium). Sauerkraut, pickled vegetables, and relishes. Olives. French fries. Onion rings. Regular canned tomato sauce and paste. Regular tomato and vegetable juice. Frozenvegetables in sauces. Grains Instant hot cereals. Bread stuffing, pancake, and biscuit mixes. Croutons. Seasoned rice or pasta mixes. Noodle soup cups. Boxed or frozen macaroni and cheese. Regular salted crackers. Self-rising flour. Rolls. Bagels. Flourtortillas and wraps. Meats and other proteins Meat or fish that is salted, canned, smoked, cured, spiced, or pickled. This includes bacon, ham, sausages, hot dogs, corned beef, chipped beef, meat loaves, salt pork, jerky, pickled herring, anchovies, regular canned tuna, andsardines. Salted nuts. Dairy Processed cheese and cheese spreads. Cheese curds. Blue cheese. Feta cheese.String cheese. Regular cottage cheese. Buttermilk. Canned milk. The items listed above may not be a complete list of foods high in sodium. Actual amounts of sodium may be different depending on processing. Contact a dietitian for more information. What foods are low in sodium? Fruits Fresh, frozen, or canned fruit with no sauce added. Fruit juice. Vegetables Fresh or frozen vegetables with no sauce added. "No salt added" canned vegetables. "No salt added" tomato sauce and paste. Low-sodium orreduced-sodium tomato and vegetable juice. Grains Noodles, pasta, quinoa, rice. Shredded or puffed wheat or puffed rice. Regular or quick oats (not instant). Low-sodium crackers. Low-sodium bread. Whole-grainbread and whole-grain pasta. Unsalted popcorn. Meats and other proteins Fresh or frozen whole meats, poultry (not injected with sodium), and fish with no sauce added. Unsalted nuts. Dried peas, beans, and   lentils without added salt. Unsalted canned beans. Eggs. Unsalted nut butters. Low-sodium canned  tunaor chicken. Dairy Milk. Soy milk. Yogurt. Low-sodium cheeses, such as Swiss, Monterey Jack, mozzarella, and ricotta. Sherbet or ice cream (keep to  cup per serving).Cream cheese. Fats and oils Unsalted butter or margarine. Other foods Homemade pudding. Sodium-free baking soda and baking powder. Herbs and spices.Low-sodium seasoning mixes. Beverages Coffee and tea. Carbonated beverages. The items listed above may not be a complete list of foods low in sodium. Actual amounts of sodium may be different depending on processing. Contact a dietitian for more information. What are some salt alternatives when cooking? The following are herbs, seasonings, and spices that can be used instead of salt to flavor your food. Herbs should be fresh or dried. Do not choose packaged mixes. Next to the name of the herb, spice, or seasoning aresome examples of foods you can pair it with. Herbs [place] leaves - Soups, meat and vegetable dishes, and spaghetti sauce. Basil - Italian dishes, soups, pasta, and fish dishes. Cilantro - Meat, poultry, and vegetable dishes. Chili powder - Marinades and Mexican dishes. Chives - Salad dressings and potato dishes. Cumin - Mexican dishes, couscous, and meat dishes. Dill - Fish dishes, sauces, and salads. Fennel - Meat and vegetable dishes, breads, and cookies. Garlic (do not use garlic salt) - Italian dishes, meat dishes, salad dressings, and sauces. Marjoram - Soups, potato dishes, and meat dishes. Oregano - Pizza and spaghetti sauce. Parsley - Salads, soups, pasta, and meat dishes. Rosemary - Italian dishes, salad dressings, soups, and red meats. Saffron - Fish dishes, pasta, and some poultry dishes. Sage - Stuffings and sauces. Tarragon - Fish and poultry dishes. Thyme - Stuffing, meat, and fish dishes. Seasonings Lemon juice - Fish dishes, poultry dishes, vegetables, and salads. Vinegar - Salad dressings, vegetables, and fish dishes. Spices Cinnamon - Sweet  dishes, such as cakes, cookies, and puddings. Cloves - Gingerbread, puddings, and marinades for meats. Curry - Vegetable dishes, fish and poultry dishes, and stir-fry dishes. Ginger - Vegetable dishes, fish dishes, and stir-fry dishes. Nutmeg - Pasta, vegetables, poultry, fish dishes, and custard. Summary Cooking with less salt is one way to reduce the amount of sodium that you get from food. Buy sodium-free or low-sodium products. Check the food label before using or buying packaged ingredients. Use herbs, seasonings without salt, and spices as substitutes for salt in foods. This information is not intended to replace advice given to you by your health care provider. Make sure you discuss any questions you have with your healthcare provider. Document Revised: 12/11/2018 Document Reviewed: 12/11/2018 Elsevier Patient Education  2022 Elsevier Inc.  

## 2021-04-28 ENCOUNTER — Other Ambulatory Visit: Payer: Self-pay

## 2021-04-28 MED ORDER — AMLODIPINE BESY-BENAZEPRIL HCL 5-40 MG PO CAPS: 1.0000 | ORAL_CAPSULE | Freq: Every day | ORAL | 2 refills | Status: DC

## 2021-06-16 ENCOUNTER — Ambulatory Visit (INDEPENDENT_AMBULATORY_CARE_PROVIDER_SITE_OTHER): Payer: Medicare Other | Admitting: Internal Medicine

## 2021-06-16 ENCOUNTER — Encounter: Payer: Self-pay | Admitting: Internal Medicine

## 2021-06-16 VITALS — BP 120/82 | HR 86 | Temp 98.1°F | Ht 63.0 in | Wt 211.8 lb

## 2021-06-16 DIAGNOSIS — R7309 Other abnormal glucose: Secondary | ICD-10-CM

## 2021-06-16 DIAGNOSIS — I10 Essential (primary) hypertension: Secondary | ICD-10-CM | POA: Diagnosis not present

## 2021-06-16 DIAGNOSIS — E7800 Pure hypercholesterolemia, unspecified: Secondary | ICD-10-CM

## 2021-06-16 DIAGNOSIS — Z2821 Immunization not carried out because of patient refusal: Secondary | ICD-10-CM

## 2021-06-16 DIAGNOSIS — Z6837 Body mass index (BMI) 37.0-37.9, adult: Secondary | ICD-10-CM

## 2021-06-16 MED ORDER — NYSTATIN 100000 UNIT/GM EX CREA: 1.0000 | TOPICAL_CREAM | Freq: Two times a day (BID) | CUTANEOUS | 1 refills | Status: AC

## 2021-06-16 NOTE — Patient Instructions (Signed)
Hypertension, Adult ?Hypertension is another name for high blood pressure. High blood pressure forces your heart to work harder to pump blood. This can cause problems over time. ?There are two numbers in a blood pressure reading. There is a top number (systolic) over a bottom number (diastolic). It is best to have a blood pressure that is below 120/80. ?What are the causes? ?The cause of this condition is not known. Some other conditions can lead to high blood pressure. ?What increases the risk? ?Some lifestyle factors can make you more likely to develop high blood pressure: ?Smoking. ?Not getting enough exercise or physical activity. ?Being overweight. ?Having too much fat, sugar, calories, or salt (sodium) in your diet. ?Drinking too much alcohol. ?Other risk factors include: ?Having any of these conditions: ?Heart disease. ?Diabetes. ?High cholesterol. ?Kidney disease. ?Obstructive sleep apnea. ?Having a family history of high blood pressure and high cholesterol. ?Age. The risk increases with age. ?Stress. ?What are the signs or symptoms? ?High blood pressure may not cause symptoms. Very high blood pressure (hypertensive crisis) may cause: ?Headache. ?Fast or uneven heartbeats (palpitations). ?Shortness of breath. ?Nosebleed. ?Vomiting or feeling like you may vomit (nauseous). ?Changes in how you see. ?Very bad chest pain. ?Feeling dizzy. ?Seizures. ?How is this treated? ?This condition is treated by making healthy lifestyle changes, such as: ?Eating healthy foods. ?Exercising more. ?Drinking less alcohol. ?Your doctor may prescribe medicine if lifestyle changes do not help enough and if: ?Your top number is above 130. ?Your bottom number is above 80. ?Your personal target blood pressure may vary. ?Follow these instructions at home: ?Eating and drinking ? ?If told, follow the DASH eating plan. To follow this plan: ?Fill one half of your plate at each meal with fruits and vegetables. ?Fill one fourth of your plate  at each meal with whole grains. Whole grains include whole-wheat pasta, brown rice, and whole-grain bread. ?Eat or drink low-fat dairy products, such as skim milk or low-fat yogurt. ?Fill one fourth of your plate at each meal with low-fat (lean) proteins. Low-fat proteins include fish, chicken without skin, eggs, beans, and tofu. ?Avoid fatty meat, cured and processed meat, or chicken with skin. ?Avoid pre-made or processed food. ?Limit the amount of salt in your diet to less than 1,500 mg each day. ?Do not drink alcohol if: ?Your doctor tells you not to drink. ?You are pregnant, may be pregnant, or are planning to become pregnant. ?If you drink alcohol: ?Limit how much you have to: ?0-1 drink a day for women. ?0-2 drinks a day for men. ?Know how much alcohol is in your drink. In the [place] one drink equals one 12 oz bottle of beer (355 mL), one 5 oz glass of wine (148 mL), or one 1? oz glass of hard liquor (44 mL). ?Lifestyle ? ?Work with your doctor to stay at a healthy weight or to lose weight. Ask your doctor what the best weight is for you. ?Get at least 30 minutes of exercise that causes your heart to beat faster (aerobic exercise) most days of the week. This may include walking, swimming, or biking. ?Get at least 30 minutes of exercise that strengthens your muscles (resistance exercise) at least 3 days a week. This may include lifting weights or doing Pilates. ?Do not smoke or use any products that contain nicotine or tobacco. If you need help quitting, ask your doctor. ?Check your blood pressure at home as told by your doctor. ?Keep all follow-up visits. ?Medicines ?Take over-the-counter and prescription medicines   only as told by your doctor. Follow directions carefully. ?Do not skip doses of blood pressure medicine. The medicine does not work as well if you skip doses. Skipping doses also puts you at risk for problems. ?Ask your doctor about side effects or reactions to medicines that you should watch  for. ?Contact a doctor if: ?You think you are having a reaction to the medicine you are taking. ?You have headaches that keep coming back. ?You feel dizzy. ?You have swelling in your ankles. ?You have trouble with your vision. ?Get help right away if: ?You get a very bad headache. ?You start to feel mixed up (confused). ?You feel weak or numb. ?You feel faint. ?You have very bad pain in your: ?Chest. ?Belly (abdomen). ?You vomit more than once. ?You have trouble breathing. ?These symptoms may be an emergency. Get help right away. Call 911. ?Do not wait to see if the symptoms will go away. ?Do not drive yourself to the hospital. ?Summary ?Hypertension is another name for high blood pressure. ?High blood pressure forces your heart to work harder to pump blood. ?For most people, a normal blood pressure is less than 120/80. ?Making healthy choices can help lower blood pressure. If your blood pressure does not get lower with healthy choices, you may need to take medicine. ?This information is not intended to replace advice given to you by your health care provider. Make sure you discuss any questions you have with your health care provider. ?Document Revised: 10/07/2020 Document Reviewed: 10/07/2020 ?Elsevier Patient Education ? 2023 [employer]vier Inc. ? ?

## 2021-06-16 NOTE — Progress Notes (Signed)
[name],acting as a Education administrator for [name], MD.,have documented all relevant documentation on the behalf of [name], MD,as directed by  [name], MD while in the presence of [name], MD.  This visit occurred during the SARS-CoV-2 public health emergency.  Safety protocols were in place, including screening questions prior to the visit, additional usage of staff PPE, and extensive cleaning of exam room while observing appropriate contact time as indicated for disinfecting solutions.  Subjective:     Patient ID: Carolyn Baker , female    DOB: [birth year] , 66 y.o.   MRN: [id number]   Chief Complaint  Patient presents with   Hypertension    HPI  Patient presents today for a bp check. Patient reports compliance with her meds. Pt states she stopped the metoprolol because about 4 weeks ago. She states she started water aerobics and feels that she no longer needed the medication.    Hypertension This is a chronic problem. The current episode started more than 1 year ago. The problem has been gradually improving since onset. The problem is controlled. Pertinent negatives include no blurred vision, chest pain, palpitations or shortness of breath. The current treatment provides moderate improvement. Compliance problems include exercise.      Past Medical History:  Diagnosis Date   Hypertension      Family History  Problem Relation Age of Onset   Stroke Mother    Dementia Mother    Prostate cancer Father    Breast cancer Maternal Aunt      Current Outpatient Medications:    amLODipine-benazepril (LOTREL) 5-40 MG capsule, Take 1 capsule by mouth daily., Disp: 90 capsule, Rfl: 2   atorvastatin (LIPITOR) 20 MG tablet, Take 1 tablet (20 mg total) by mouth daily., Disp: 90 tablet, Rfl: 1   Cholecalciferol (VITAMIN D3) 125 MCG (5000 UT) CAPS, Take by mouth., Disp: , Rfl:    hydrochlorothiazide (HYDRODIURIL) 25 MG tablet, Take 1 tablet (25 mg total) by  mouth daily., Disp: 90 tablet, Rfl: 2   nystatin cream (MYCOSTATIN), Apply 1 Application topically 2 (two) times daily. Please add use as needed, Disp: 45 g, Rfl: 1   No Known Allergies   Review of Systems  Constitutional: Negative.   Eyes:  Negative for blurred vision.  Respiratory: Negative.  Negative for shortness of breath.   Cardiovascular: Negative.  Negative for chest pain and palpitations.  Gastrointestinal: Negative.   Neurological: Negative.   Psychiatric/Behavioral: Negative.       Today's Vitals   06/16/21 0922  BP: 120/82  Pulse: 86  Temp: 98.1 F (36.7 C)  Weight: 211 lb 12.8 oz (96.1 kg)  Height: _0  (1.6 m)  PainSc: 0-No pain   Body mass index is 37.52 kg/m.  Wt Readings from Last 3 Encounters:  06/16/21 211 lb 12.8 oz (96.1 kg)  03/28/21 207 lb (93.9 kg)  02/22/21 209 lb (94.8 kg)     Objective:  Physical Exam Vitals and nursing note reviewed.  Constitutional:      Appearance: Normal appearance.  HENT:     Head: Normocephalic and atraumatic.  Eyes:     Extraocular Movements: Extraocular movements intact.  Cardiovascular:     Rate and Rhythm: Normal rate and regular rhythm.     Heart sounds: Normal heart sounds.  Pulmonary:     Effort: Pulmonary effort is normal.     Breath sounds: Normal breath sounds.  Musculoskeletal:     Cervical back: Normal  range of motion.  Skin:    General: Skin is warm.  Neurological:     General: No focal deficit present.     Mental Status: She is alert.  Psychiatric:        Mood and Affect: Mood normal.        Behavior: Behavior normal.      Assessment And Plan:     1. Essential hypertension, benign Comments: Chronic, well controlled.  She is encouraged to follow low sodium diet.  She will c/w Lotrel and HCTZ for now. Congratulated on lifestyle changes.  - CMP14+EGFR - Lipid panel  2. Other abnormal glucose Comments: Her a1c has been elevated in the past. She is encouraged to decrease her intake of  sugary foods and beverages.  - Hemoglobin A1c  3. Pure hypercholesterolemia Comments: Chronic, currently on atorvastatin 39m daily.  She will f/u in 6 months for a full physical examination.  - CMP14+EGFR - Lipid panel  4. Class 2 severe obesity due to excess calories with serious comorbidity and body mass index (BMI) of 37.0 to 37.9 in adult ([facility] Comments: She has gained 4 lbs since March 2023. She is encouraged to aim for at least 150 minutes of exercise per week.   5. Pneumococcal vaccination declined   Patient was given opportunity to ask questions. Patient verbalized understanding of the plan and was able to repeat key elements of the plan. All questions were answered to their satisfaction.   I, RMaximino Greenland MD, have reviewed all documentation for this visit. The documentation on 06/16/21 for the exam, diagnosis, procedures, and orders are all accurate and complete.   IF YOU HAVE BEEN REFERRED TO A SPECIALIST, IT MAY TAKE 1-2 WEEKS TO SCHEDULE/PROCESS THE REFERRAL. IF YOU HAVE NOT HEARD FROM US/SPECIALIST IN TWO WEEKS, PLEASE GIVE [place] CALL AT [phone].   THE PATIENT IS ENCOURAGED TO PRACTICE SOCIAL DISTANCING DUE TO THE COVID-19 PANDEMIC.

## 2021-06-17 LAB — CMP14+EGFR
ALT: 25 IU/L (ref 0–32)
AST: 17 IU/L (ref 0–40)
Albumin/Globulin Ratio: 1.3 (ref 1.2–2.2)
Albumin: 4.4 g/dL (ref 3.8–4.8)
Alkaline Phosphatase: 58 IU/L (ref 44–121)
BUN/Creatinine Ratio: 16 (ref 12–28)
BUN: 16 mg/dL (ref 8–27)
Bilirubin Total: 0.5 mg/dL (ref 0.0–1.2)
CO2: 22 mmol/L (ref 20–29)
Calcium: 9.6 mg/dL (ref 8.7–10.3)
Chloride: 105 mmol/L (ref 96–106)
Creatinine, Ser: 0.97 mg/dL (ref 0.57–1.00)
Globulin, Total: 3.5 g/dL (ref 1.5–4.5)
Glucose: 99 mg/dL (ref 70–99)
Potassium: 4.4 mmol/L (ref 3.5–5.2)
Sodium: 144 mmol/L (ref 134–144)
Total Protein: 7.9 g/dL (ref 6.0–8.5)
eGFR: 65 mL/min/{1.73_m2} (ref >59)

## 2021-06-17 LAB — HEMOGLOBIN A1C
Est. average glucose Bld gHb Est-mCnc: 120 mg/dL
Hgb A1c MFr Bld: 5.8 % — ABNORMAL HIGH (ref 4.8–5.6)

## 2021-06-17 LAB — LIPID PANEL
Chol/HDL Ratio: 3.4 ratio (ref 0.0–4.4)
Cholesterol, Total: 188 mg/dL (ref 100–199)
HDL: 55 mg/dL (ref >39)
LDL Chol Calc (NIH): 119 mg/dL — ABNORMAL HIGH (ref 0–99)
Triglycerides: 76 mg/dL (ref 0–149)
VLDL Cholesterol Cal: 14 mg/dL (ref 5–40)

## 2021-11-21 ENCOUNTER — Encounter: Admit: 2021-11-21 | Discharge: 2021-11-21 | Payer: MEDICARE

## 2022-01-04 ENCOUNTER — Ambulatory Visit (INDEPENDENT_AMBULATORY_CARE_PROVIDER_SITE_OTHER): Payer: Medicare Other | Admitting: Internal Medicine

## 2022-01-04 ENCOUNTER — Encounter: Payer: Self-pay | Admitting: Internal Medicine

## 2022-01-04 VITALS — BP 160/100 | HR 96 | Temp 98.1°F | Ht 63.0 in | Wt 215.8 lb

## 2022-01-04 DIAGNOSIS — I10 Essential (primary) hypertension: Secondary | ICD-10-CM

## 2022-01-04 DIAGNOSIS — E7800 Pure hypercholesterolemia, unspecified: Secondary | ICD-10-CM | POA: Diagnosis not present

## 2022-01-04 DIAGNOSIS — Z6837 Body mass index (BMI) 37.0-37.9, adult: Secondary | ICD-10-CM

## 2022-01-04 DIAGNOSIS — H5369 Other night blindness: Secondary | ICD-10-CM

## 2022-01-04 DIAGNOSIS — E2839 Other primary ovarian failure: Secondary | ICD-10-CM

## 2022-01-04 DIAGNOSIS — Z0001 Encounter for general adult medical examination with abnormal findings: Secondary | ICD-10-CM | POA: Diagnosis not present

## 2022-01-04 DIAGNOSIS — R7309 Other abnormal glucose: Secondary | ICD-10-CM | POA: Diagnosis not present

## 2022-01-04 DIAGNOSIS — E66812 Obesity, class 2: Secondary | ICD-10-CM

## 2022-01-04 DIAGNOSIS — Z0000 Encounter for general adult medical examination without abnormal findings: Secondary | ICD-10-CM

## 2022-01-04 LAB — POCT URINALYSIS DIPSTICK
Bilirubin, UA: NEGATIVE
Blood, UA: NEGATIVE
Glucose, UA: NEGATIVE
Ketones, UA: NEGATIVE
Nitrite, UA: NEGATIVE
Protein, UA: NEGATIVE
Spec Grav, UA: 1.015 (ref 1.010–1.025)
Urobilinogen, UA: 0.2 E.U./dL
pH, UA: 6 (ref 5.0–8.0)

## 2022-01-04 MED ORDER — AMLODIPINE BESYLATE-VALSARTAN 5-160 MG PO TABS: 1.0000 | ORAL_TABLET | Freq: Every day | ORAL | 3 refills | Status: DC

## 2022-01-04 NOTE — Patient Instructions (Signed)

## 2022-01-04 NOTE — Progress Notes (Signed)
Subjective:    Carolyn Baker is a 67 y.o. female who presents for a Welcome to Medicare exam.    She is here today for a full physical examination.  She now has Medicare, so she will have Carolyn Baker visit today.  She is no longer followed by GYN.  She is s/p hysterectomy. She reports compliance with meds.  She denies headaches, chest pain and shortness of breath.   Hypertension This is a chronic problem. The current episode started more than 1 year ago. The problem has been gradually improving since onset. The problem is controlled. Pertinent negatives include no blurred vision, chest pain, palpitations or shortness of breath. The current treatment provides moderate improvement. Compliance problems include exercise.     Review of Systems Review of Systems  Constitutional: Negative.   HENT: Negative.    Eyes: Negative.  Negative for blurred vision.  Respiratory: Negative.  Negative for shortness of breath.   Cardiovascular: Negative.  Negative for chest pain and palpitations.  Gastrointestinal: Negative.   Genitourinary: Negative.   Musculoskeletal: Negative.   Skin: Negative.   Neurological: Negative.   Endo/Heme/Allergies: Negative.   Psychiatric/Behavioral: Negative.         Objective:    Today's Vitals   01/04/22 1114 01/04/22 1138  BP: (!) 160/122 (!) 160/100  Pulse: 96   Temp: 98.1 F (36.7 C)   SpO2: 98%   Weight: 215 lb 12.8 oz (97.9 kg)   Height: '5\' 3"'$  (1.6 m)   PainSc: 3    Body mass index is 38.23 kg/m.  Medications Outpatient Encounter Medications as of 01/04/2022  Medication Sig   amLODipine-valsartan (EXFORGE) 5-160 MG tablet Take 1 tablet by mouth daily.   Cholecalciferol (VITAMIN D3) 125 MCG (5000 UT) CAPS Take by mouth.   nystatin cream (MYCOSTATIN) Apply 1 Application topically 2 (two) times daily. Please add use as needed   [DISCONTINUED] amLODipine-benazepril (LOTREL) 5-40 MG capsule Take 1 capsule by mouth daily.   [DISCONTINUED] atorvastatin  (LIPITOR) 20 MG tablet Take 1 tablet (20 mg total) by mouth daily.   [DISCONTINUED] hydrochlorothiazide (HYDRODIURIL) 25 MG tablet Take 1 tablet (25 mg total) by mouth daily.   No facility-administered encounter medications on file as of 01/04/2022.     History: Past Medical History:  Diagnosis Date   Hypertension    Past Surgical History:  Procedure Laterality Date   PARTIAL HYSTERECTOMY  1998    Family History  Problem Relation Age of Onset   Stroke Mother    Dementia Mother    Prostate cancer Father    Breast cancer Maternal Aunt    Social History   Occupational History   Not on file  Tobacco Use   Smoking status: Never   Smokeless tobacco: Never  Vaping Use   Vaping Use: Never used  Substance and Sexual Activity   Alcohol use: Yes    Comment: rarely   Drug use: Never   Sexual activity: Not on file    Tobacco Counseling Counseling given: Yes   Immunizations and Health Maintenance Immunization History  Administered Date(s) Administered   PFIZER(Purple Top)SARS-COV-2 Vaccination 04/10/2019, 05/06/2019   Health Maintenance Due  Topic Date Due   DEXA SCAN  Never done   COVID-19 Vaccine (3 - 2023-24 season) 09/02/2021   MAMMOGRAM  09/09/2021    Activities of Daily Living    01/04/2022   11:20 AM  In your present state of health, do you have any difficulty performing the following activities:  Hearing? 0  Vision? 0  Difficulty concentrating or making decisions? 0  Walking or climbing stairs? 0  Dressing or bathing? 0  Doing errands, shopping? 0    Physical Exam    Physical Exam Vitals and nursing note reviewed.  Constitutional:      Appearance: Normal appearance. She is obese.  HENT:     Head: Normocephalic and atraumatic.     Right Ear: Tympanic membrane, ear canal and external ear normal.     Left Ear: Tympanic membrane, ear canal and external ear normal.     Nose:     Comments: Masked     Mouth/Throat:     Comments: Masked  Eyes:      Extraocular Movements: Extraocular movements intact.     Conjunctiva/sclera: Conjunctivae normal.     Pupils: Pupils are equal, round, and reactive to light.  Cardiovascular:     Rate and Rhythm: Normal rate and regular rhythm.     Pulses: Normal pulses.     Heart sounds: Normal heart sounds.  Pulmonary:     Effort: Pulmonary effort is normal.     Breath sounds: Normal breath sounds.  Chest:  Breasts:    Tanner Score is 5.     Right: Normal.     Left: Normal.  Abdominal:     General: Bowel sounds are normal.     Palpations: Abdomen is soft.     Comments: Soft, obese.   Musculoskeletal:        General: Normal range of motion.     Cervical back: Normal range of motion.  Skin:    General: Skin is warm and dry.  Neurological:     General: No focal deficit present.     Mental Status: She is alert and oriented to person, place, and time.  Psychiatric:        Mood and Affect: Mood normal.        Behavior: Behavior normal.      (optional), or other factors deemed appropriate based on the beneficiary's medical and social history and current clinical standards.  Advanced Directives: Does Patient Have a Medical Advance Directive?: No Would patient like information on creating a medical advance directive?: Yes (MAU/Ambulatory/Procedural Areas - Information given)    Assessment:    This is a routine wellness examination for this patient .   Vision/Hearing screen Hearing Screening   '1000Hz'$  '2000Hz'$  '4000Hz'$  '5000Hz'$   Right ear Fail Fail Fail Pass  Left ear Fail Pass Fail Fail   Vision Screening   Right eye Left eye Both eyes  Without correction '20/30 20/30 20/50 '$  With correction       Dietary issues and exercise activities discussed:      Goals      Blood Pressure < 130/80     Weight (lb) < 200 lb (90.7 kg)      Depression Screen    01/04/2022   11:13 AM 03/28/2021    3:18 PM 02/17/2020    9:19 AM 12/25/2018    2:39 PM  PHQ 2/9 Scores  PHQ - 2 Score 0 0 0 0     Fall  Risk    01/04/2022   11:13 AM  Fall Risk   Falls in the past year? 0  Number falls in past yr: 0  Injury with Fall? 0  Risk for fall due to : No Fall Risks  Follow up Falls evaluation completed    Cognitive Function:        01/04/2022   11:22 AM  6CIT Screen  What Year? 0 points  What month? 0 points  What time? 0 points  Count back from 20 0 points  Months in reverse 0 points  Repeat phrase 4 points  Total Score 4 points    Patient Care Team: Glendale Chard, MD as PCP - General (Internal Medicine)     Plan:  1. Encounter for Medicare annual wellness exam The Welcome to Medicare annual wellness visit was performed including discussion of advanced directives, assessment of functional status and cognitive function. EKG performed, NSR w/o acute changes. A full exam was also performed. She will rto in one year for AWV with [facility] Advisor.  PATIENT IS ADVISED TO GET 30-45 MINUTES REGULAR EXERCISE NO LESS THAN FOUR TO FIVE DAYS PER WEEK - BOTH WEIGHTBEARING EXERCISES AND AEROBIC ARE RECOMMENDED.  PATIENT IS ADVISED TO FOLLOW A HEALTHY DIET WITH AT LEAST SIX FRUITS/VEGGIES PER DAY, DECREASE INTAKE OF RED MEAT, AND TO INCREASE FISH INTAKE TO TWO DAYS PER WEEK.  MEATS/FISH SHOULD NOT BE FRIED, BAKED OR BROILED IS PREFERABLE.  IT IS ALSO IMPORTANT TO CUT BACK ON YOUR SUGAR INTAKE. PLEASE AVOID ANYTHING WITH ADDED SUGAR, CORN SYRUP OR OTHER SWEETENERS. IF YOU MUST USE A SWEETENER, YOU CAN TRY STEVIA. IT IS ALSO IMPORTANT TO AVOID ARTIFICIALLY SWEETENERS AND DIET BEVERAGES. LASTLY, I SUGGEST WEARING SPF 50 SUNSCREEN ON EXPOSED PARTS AND ESPECIALLY WHEN IN THE DIRECT SUNLIGHT FOR AN EXTENDED PERIOD OF TIME.  PLEASE AVOID FAST FOOD RESTAURANTS AND INCREASE YOUR WATER INTAKE.    2. Essential hypertension, benign Comments: Chronic, uncontrolled due to noncompliance. Importance of medication/dietary/OV compliance was d/w patient. EKG performed, NSR w/o acute changes.  I will stop Lotrel and switch her  to amlodipine/valsartan. She will rto in 1 week for a nurse visit.  She will c/w hctz '25mg'$  daily.  If BP is elevated at next visit, I plan to add amlodipine '5mg'$  nightly to her current regimen. Also advised to gradually increase her daily activity level to help her achieve optimal BP readings. All questions were answered to her satisfaction.  - POCT Urinalysis Dipstick (81002) - Microalbumin / creatinine urine ratio - EKG 12-Lead - CMP14+EGFR - CBC - Lipid panel - Ambulatory referral to Optometry  3. Other abnormal glucose Comments: Her a1c has been elevated in the past. I will recheck an a1c today. She is advised to decrease sugary foods and beverages. - Hemoglobin A1c  4. Pure hypercholesterolemia Comments: Chronic, she is encouraged to take atorvastatin '20mg'$  daily. Also advised to follow heart healthy lifestyle. - Lipid panel  5. Diminished night vision Comments: She may be developing cataracts. She is encouraged to have eye exam in the near future.  I will refer her to Optometry for full eye exam. - Ambulatory referral to Optometry  6. Estrogen deficiency Comments: I will schedule her for bone density. - DG Bone Density; Future  7. Class 2 severe obesity due to excess calories with serious comorbidity and body mass index (BMI) of 37.0 to 37.9 in adult [facility]) Comments: Chronic, she is encouraged to aim for at least 150 minutes of exercise/week, while striving for BMI<30 to decrease cardiac risk.  I have personally reviewed and noted the following in the patient's chart:   Medical and social history Use of alcohol, tobacco or illicit drugs  Current medications and supplements Functional ability and status Nutritional status Physical activity Advanced directives List of other physicians Hospitalizations, surgeries, and ER visits in previous 12 months Vitals Screenings to include cognitive, depression,  and falls Referrals and appointments  In addition, I have reviewed and  discussed with patient certain preventive protocols, quality metrics, and best practice recommendations. A written personalized care plan for preventive services as well as general preventive health recommendations were provided to patient.     [name], MD 01/09/2022

## 2022-01-05 LAB — CBC
Hematocrit: 46.7 % — ABNORMAL HIGH (ref 34.0–46.6)
Hemoglobin: 15.5 g/dL (ref 11.1–15.9)
MCH: 31.1 pg (ref 26.6–33.0)
MCHC: 33.2 g/dL (ref 31.5–35.7)
MCV: 94 fL (ref 79–97)
Platelets: 196 10*3/uL (ref 150–450)
RBC: 4.98 x10E6/uL (ref 3.77–5.28)
RDW: 13 % (ref 11.7–15.4)
WBC: 5.2 10*3/uL (ref 3.4–10.8)

## 2022-01-05 LAB — CMP14+EGFR
ALT: 23 IU/L (ref 0–32)
AST: 17 IU/L (ref 0–40)
Albumin/Globulin Ratio: 1.3 (ref 1.2–2.2)
Albumin: 4.4 g/dL (ref 3.9–4.9)
Alkaline Phosphatase: 56 IU/L (ref 44–121)
BUN/Creatinine Ratio: 11 — ABNORMAL LOW (ref 12–28)
BUN: 11 mg/dL (ref 8–27)
Bilirubin Total: 0.3 mg/dL (ref 0.0–1.2)
CO2: 24 mmol/L (ref 20–29)
Calcium: 9.5 mg/dL (ref 8.7–10.3)
Chloride: 102 mmol/L (ref 96–106)
Creatinine, Ser: 1.01 mg/dL — ABNORMAL HIGH (ref 0.57–1.00)
Globulin, Total: 3.4 g/dL (ref 1.5–4.5)
Glucose: 89 mg/dL (ref 70–99)
Potassium: 4 mmol/L (ref 3.5–5.2)
Sodium: 142 mmol/L (ref 134–144)
Total Protein: 7.8 g/dL (ref 6.0–8.5)
eGFR: 62 mL/min/{1.73_m2} (ref >59)

## 2022-01-05 LAB — LIPID PANEL
Chol/HDL Ratio: 3.6 ratio (ref 0.0–4.4)
Cholesterol, Total: 194 mg/dL (ref 100–199)
HDL: 54 mg/dL (ref >39)
LDL Chol Calc (NIH): 121 mg/dL — ABNORMAL HIGH (ref 0–99)
Triglycerides: 106 mg/dL (ref 0–149)
VLDL Cholesterol Cal: 19 mg/dL (ref 5–40)

## 2022-01-05 LAB — HEMOGLOBIN A1C
Est. average glucose Bld gHb Est-mCnc: 120 mg/dL
Hgb A1c MFr Bld: 5.8 % — ABNORMAL HIGH (ref 4.8–5.6)

## 2022-01-05 LAB — MICROALBUMIN / CREATININE URINE RATIO
Creatinine, Urine: 90.9 mg/dL
Microalb/Creat Ratio: 4 mg/g creat (ref 0–29)
Microalbumin, Urine: 3.2 ug/mL

## 2022-01-06 ENCOUNTER — Other Ambulatory Visit: Payer: Self-pay

## 2022-01-06 MED ORDER — ATORVASTATIN CALCIUM 20 MG PO TABS: 20.0000 mg | ORAL_TABLET | Freq: Every day | ORAL | 1 refills | Status: DC

## 2022-01-06 MED ORDER — HYDROCHLOROTHIAZIDE 25 MG PO TABS: 25.0000 mg | ORAL_TABLET | Freq: Every day | ORAL | 2 refills | Status: DC

## 2022-01-20 ENCOUNTER — Encounter: Admit: 2022-01-20 | Discharge: 2022-01-20 | Payer: MEDICARE

## 2022-01-24 ENCOUNTER — Ambulatory Visit: Payer: Medicare Other

## 2022-01-24 ENCOUNTER — Other Ambulatory Visit: Payer: Self-pay

## 2022-01-24 VITALS — BP 154/92 | HR 100 | Temp 98.4°F

## 2022-01-24 DIAGNOSIS — I10 Essential (primary) hypertension: Secondary | ICD-10-CM

## 2022-01-24 DIAGNOSIS — Z79899 Other long term (current) drug therapy: Secondary | ICD-10-CM | POA: Diagnosis not present

## 2022-01-24 LAB — BMP8+EGFR
BUN/Creatinine Ratio: 15 (ref 12–28)
BUN: 17 mg/dL (ref 8–27)
CO2: 22 mmol/L (ref 20–29)
Calcium: 9.7 mg/dL (ref 8.7–10.3)
Chloride: 101 mmol/L (ref 96–106)
Creatinine, Ser: 1.12 mg/dL — ABNORMAL HIGH (ref 0.57–1.00)
Glucose: 101 mg/dL — ABNORMAL HIGH (ref 70–99)
Potassium: 3.8 mmol/L (ref 3.5–5.2)
Sodium: 138 mmol/L (ref 134–144)
eGFR: 55 mL/min/{1.73_m2} — ABNORMAL LOW (ref >59)

## 2022-01-24 MED ORDER — AMLODIPINE BESYLATE-VALSARTAN 5-160 MG PO TABS: 1.0000 | ORAL_TABLET | Freq: Two times a day (BID) | ORAL | 1 refills | Status: DC

## 2022-01-24 NOTE — Progress Notes (Signed)
Pt presents today for bpc. She is currently taking amlodipine -valsartan 5-'160mg'$  and hctz '25mg'$ . Her BP medication was changed last visit from amlodipine-benazepril 5-'40mg'$  to amlodipine-valsartan 5/'160mg'$ .   BP Readings from Last 3 Encounters:  01/24/22 (!) 160/98  01/04/22 (!) 160/100  06/16/21 120/82   Patient states that she is pain from a shoulder strain this morning. Provider would like for patient to take amlodipine-valsartan 5/'160mg'$  twice daily. She will return in 2 weeks for nurse and get bmp at that time.

## 2022-01-31 ENCOUNTER — Encounter: Admit: 2022-01-31 | Discharge: 2022-01-31 | Payer: MEDICARE

## 2022-01-31 ENCOUNTER — Ambulatory Visit: Admit: 2022-01-31 | Discharge: 2022-02-01 | Payer: MEDICARE

## 2022-01-31 DIAGNOSIS — J449 Chronic obstructive pulmonary disease, unspecified: Secondary | ICD-10-CM

## 2022-01-31 DIAGNOSIS — D696 Thrombocytopenia, unspecified: Secondary | ICD-10-CM

## 2022-01-31 DIAGNOSIS — J3089 Other allergic rhinitis: Secondary | ICD-10-CM

## 2022-01-31 DIAGNOSIS — E876 Hypokalemia: Secondary | ICD-10-CM

## 2022-01-31 DIAGNOSIS — I679 Cerebrovascular disease, unspecified: Secondary | ICD-10-CM

## 2022-01-31 DIAGNOSIS — J410 Simple chronic bronchitis: Secondary | ICD-10-CM

## 2022-01-31 DIAGNOSIS — R600 Localized edema: Secondary | ICD-10-CM

## 2022-01-31 DIAGNOSIS — T502X5A Adverse effect of carbonic-anhydrase inhibitors, benzothiadiazides and other diuretics, initial encounter: Secondary | ICD-10-CM

## 2022-01-31 DIAGNOSIS — I10 Essential (primary) hypertension: Secondary | ICD-10-CM

## 2022-01-31 DIAGNOSIS — E119 Type 2 diabetes mellitus without complications: Secondary | ICD-10-CM

## 2022-01-31 DIAGNOSIS — G40909 Epilepsy, unspecified, not intractable, without status epilepticus: Secondary | ICD-10-CM

## 2022-01-31 DIAGNOSIS — R519 Headache, unspecified: Secondary | ICD-10-CM

## 2022-01-31 DIAGNOSIS — Z96651 Presence of right artificial knee joint: Secondary | ICD-10-CM

## 2022-01-31 DIAGNOSIS — K219 Gastro-esophageal reflux disease without esophagitis: Secondary | ICD-10-CM

## 2022-01-31 DIAGNOSIS — G912 (Idiopathic) normal pressure hydrocephalus: Secondary | ICD-10-CM

## 2022-01-31 DIAGNOSIS — E039 Hypothyroidism, unspecified: Secondary | ICD-10-CM

## 2022-01-31 DIAGNOSIS — F419 Anxiety disorder, unspecified: Secondary | ICD-10-CM

## 2022-01-31 DIAGNOSIS — F0393 Unspecified dementia, unspecified severity, with mood disturbance (HCC): Secondary | ICD-10-CM

## 2022-01-31 DIAGNOSIS — G44221 Chronic tension-type headache, intractable: Secondary | ICD-10-CM

## 2022-01-31 DIAGNOSIS — M659 Synovitis and tenosynovitis, unspecified: Secondary | ICD-10-CM | POA: Diagnosis not present

## 2022-01-31 DIAGNOSIS — Z7901 Long term (current) use of anticoagulants: Secondary | ICD-10-CM | POA: Diagnosis not present

## 2022-01-31 DIAGNOSIS — M25571 Pain in right ankle and joints of right foot: Secondary | ICD-10-CM | POA: Diagnosis not present

## 2022-01-31 NOTE — Patient Instructions
It was a pleasure seeing you today in the Neurosurgery Clinic. Dr. Peterson would like to see you back in clinic in 2 months with a CT Head prior. Our scheduling team should contact you to schedule your appointments.     If you have any questions or concerns, please don't hesitate to call me at [phone] or send a message via MyChart.  Thank you and take care!     Renji Berwick L., RN, BSN  Clinical Nurse Coordinator  Dr. Jeremy Peterson & Maria Huynh, APRN-NP  [facility]  Phone: [phone] - Fax: [phone]          Communication: Our preferred method of communication is through MyChart messages. You may also call my direct RN line for questions and/or concerns at [phone].  Additional Phone Numbers:  For urgent concerns after hours, on weekends and/or holidays, you may contact [phone] and ask for the doctor on-call for neurosurgery.  For clinic appointment scheduling, call  [phone], select option 1.  For radiology scheduling, please call [phone].  For MyChart assistance, please call [phone].

## 2022-02-01 DIAGNOSIS — G919 Hydrocephalus, unspecified: Secondary | ICD-10-CM

## 2022-02-02 ENCOUNTER — Encounter: Admit: 2022-02-02 | Discharge: 2022-02-02 | Payer: MEDICARE

## 2022-02-02 DIAGNOSIS — Q030 Malformations of aqueduct of Sylvius: Secondary | ICD-10-CM

## 2022-02-02 DIAGNOSIS — G919 Hydrocephalus, unspecified: Secondary | ICD-10-CM

## 2022-02-02 DIAGNOSIS — R42 Dizziness and giddiness: Secondary | ICD-10-CM

## 2022-02-02 NOTE — Telephone Encounter
As follow up from 01/31/22 visit, this RN    -faxed to [facility] Neurology: Dr. Elam Dutch 01/31/22 Clinic Note and demographic sheet with request stating "Please contact our mutual patient (established patient with you) to schedule a sooner appointment. Attached neurosurgery note to review. "        Ph: [phone]    -faxed to [facility] ENT: Referral, Dr. Elam Dutch 01/31/22 Clinic Note and demographic sheet with request stating "Please review neurosurgery note for need for ENT evaluation. Evaluation pending possible neurosurgical intervention, so ASAP appointment would be appreciated."       Ph: [phone]

## 2022-02-07 ENCOUNTER — Other Ambulatory Visit: Payer: Self-pay

## 2022-02-07 ENCOUNTER — Ambulatory Visit: Payer: Medicare Other

## 2022-02-07 VITALS — BP 158/98 | HR 96 | Temp 98.1°F | Ht 63.0 in | Wt 215.0 lb

## 2022-02-07 DIAGNOSIS — Z79899 Other long term (current) drug therapy: Secondary | ICD-10-CM | POA: Diagnosis not present

## 2022-02-07 DIAGNOSIS — I10 Essential (primary) hypertension: Secondary | ICD-10-CM

## 2022-02-07 LAB — BMP8+EGFR
BUN/Creatinine Ratio: 24 (ref 12–28)
BUN: 27 mg/dL (ref 8–27)
CO2: 24 mmol/L (ref 20–29)
Calcium: 9.8 mg/dL (ref 8.7–10.3)
Chloride: 96 mmol/L (ref 96–106)
Creatinine, Ser: 1.14 mg/dL — ABNORMAL HIGH (ref 0.57–1.00)
Glucose: 99 mg/dL (ref 70–99)
Potassium: 3.5 mmol/L (ref 3.5–5.2)
Sodium: 137 mmol/L (ref 134–144)
eGFR: 53 mL/min/{1.73_m2} — ABNORMAL LOW (ref >59)

## 2022-02-07 MED ORDER — HYDROCHLOROTHIAZIDE 25 MG PO TABS: 25.0000 mg | ORAL_TABLET | Freq: Every day | ORAL | 2 refills | Status: DC

## 2022-02-07 MED ORDER — AMLODIPINE BESYLATE 10 MG PO TABS: 10.0000 mg | ORAL_TABLET | Freq: Every day | ORAL | 1 refills | Status: DC

## 2022-02-07 MED ORDER — VALSARTAN 160 MG PO TABS: 160.0000 mg | ORAL_TABLET | Freq: Every day | ORAL | 2 refills | Status: DC

## 2022-02-07 MED ORDER — ATORVASTATIN CALCIUM 20 MG PO TABS: 20.0000 mg | ORAL_TABLET | Freq: Every day | ORAL | 1 refills | Status: DC

## 2022-02-07 NOTE — Progress Notes (Signed)
Pt presents today for bpc. She states her insurance would not pay for enough of a supply for Amlodipine-valsartan  '5MG'$  to take twice daily. She takes once a day at 8am. Along with hydrochlorothiazide '25MG'$ , taken in the morning. She does not exercise regularly. Patient drinks at least 6 bottles of water a day. For breakfast this morning she ate cereal.  BP Readings from Last 3 Encounters:  02/07/22 (!) 158/98  01/24/22 (!) 154/92  01/04/22 (!) 160/100  Per provider patient is to take Amlodipine '10MG'$  at night. Valsartan '160MG'$  in the morning. 2 week nurse visit.

## 2022-02-16 DIAGNOSIS — M65871 Other synovitis and tenosynovitis, right ankle and foot: Secondary | ICD-10-CM | POA: Diagnosis not present

## 2022-02-16 DIAGNOSIS — M25571 Pain in right ankle and joints of right foot: Secondary | ICD-10-CM | POA: Diagnosis not present

## 2022-02-16 DIAGNOSIS — Z7901 Long term (current) use of anticoagulants: Secondary | ICD-10-CM | POA: Diagnosis not present

## 2022-02-24 ENCOUNTER — Other Ambulatory Visit: Payer: Self-pay

## 2022-02-24 ENCOUNTER — Ambulatory Visit: Payer: Medicare Other

## 2022-02-24 VITALS — BP 122/80 | Temp 98.1°F | Ht 63.0 in | Wt 211.0 lb

## 2022-02-24 DIAGNOSIS — I10 Essential (primary) hypertension: Secondary | ICD-10-CM

## 2022-02-24 MED ORDER — AMLODIPINE BESYLATE 10 MG PO TABS: 10.0000 mg | ORAL_TABLET | Freq: Every day | ORAL | 1 refills | Status: DC

## 2022-02-24 MED ORDER — HYDROCHLOROTHIAZIDE 25 MG PO TABS: 25.0000 mg | ORAL_TABLET | Freq: Every day | ORAL | 2 refills | Status: DC

## 2022-02-24 MED ORDER — VALSARTAN 160 MG PO TABS: 160.0000 mg | ORAL_TABLET | Freq: Every day | ORAL | 2 refills | Status: DC

## 2022-02-24 MED ORDER — ATORVASTATIN CALCIUM 20 MG PO TABS: 20.0000 mg | ORAL_TABLET | Freq: Every day | ORAL | 1 refills | Status: DC

## 2022-02-24 NOTE — Patient Instructions (Signed)
Hypertension, Adult ?Hypertension is another name for high blood pressure. High blood pressure forces your heart to work harder to pump blood. This can cause problems over time. ?There are two numbers in a blood pressure reading. There is a top number (systolic) over a bottom number (diastolic). It is best to have a blood pressure that is below 120/80. ?What are the causes? ?The cause of this condition is not known. Some other conditions can lead to high blood pressure. ?What increases the risk? ?Some lifestyle factors can make you more likely to develop high blood pressure: ?Smoking. ?Not getting enough exercise or physical activity. ?Being overweight. ?Having too much fat, sugar, calories, or salt (sodium) in your diet. ?Drinking too much alcohol. ?Other risk factors include: ?Having any of these conditions: ?Heart disease. ?Diabetes. ?High cholesterol. ?Kidney disease. ?Obstructive sleep apnea. ?Having a family history of high blood pressure and high cholesterol. ?Age. The risk increases with age. ?Stress. ?What are the signs or symptoms? ?High blood pressure may not cause symptoms. Very high blood pressure (hypertensive crisis) may cause: ?Headache. ?Fast or uneven heartbeats (palpitations). ?Shortness of breath. ?Nosebleed. ?Vomiting or feeling like you may vomit (nauseous). ?Changes in how you see. ?Very bad chest pain. ?Feeling dizzy. ?Seizures. ?How is this treated? ?This condition is treated by making healthy lifestyle changes, such as: ?Eating healthy foods. ?Exercising more. ?Drinking less alcohol. ?Your doctor may prescribe medicine if lifestyle changes do not help enough and if: ?Your top number is above 130. ?Your bottom number is above 80. ?Your personal target blood pressure may vary. ?Follow these instructions at home: ?Eating and drinking ? ?If told, follow the DASH eating plan. To follow this plan: ?Fill one half of your plate at each meal with fruits and vegetables. ?Fill one fourth of your plate  at each meal with whole grains. Whole grains include whole-wheat pasta, brown rice, and whole-grain bread. ?Eat or drink low-fat dairy products, such as skim milk or low-fat yogurt. ?Fill one fourth of your plate at each meal with low-fat (lean) proteins. Low-fat proteins include fish, chicken without skin, eggs, beans, and tofu. ?Avoid fatty meat, cured and processed meat, or chicken with skin. ?Avoid pre-made or processed food. ?Limit the amount of salt in your diet to less than 1,500 mg each day. ?Do not drink alcohol if: ?Your doctor tells you not to drink. ?You are pregnant, may be pregnant, or are planning to become pregnant. ?If you drink alcohol: ?Limit how much you have to: ?0-1 drink a day for women. ?0-2 drinks a day for men. ?Know how much alcohol is in your drink. In the [place] one drink equals one 12 oz bottle of beer (355 mL), one 5 oz glass of wine (148 mL), or one 1? oz glass of hard liquor (44 mL). ?Lifestyle ? ?Work with your doctor to stay at a healthy weight or to lose weight. Ask your doctor what the best weight is for you. ?Get at least 30 minutes of exercise that causes your heart to beat faster (aerobic exercise) most days of the week. This may include walking, swimming, or biking. ?Get at least 30 minutes of exercise that strengthens your muscles (resistance exercise) at least 3 days a week. This may include lifting weights or doing Pilates. ?Do not smoke or use any products that contain nicotine or tobacco. If you need help quitting, ask your doctor. ?Check your blood pressure at home as told by your doctor. ?Keep all follow-up visits. ?Medicines ?Take over-the-counter and prescription medicines   only as told by your doctor. Follow directions carefully. ?Do not skip doses of blood pressure medicine. The medicine does not work as well if you skip doses. Skipping doses also puts you at risk for problems. ?Ask your doctor about side effects or reactions to medicines that you should watch  for. ?Contact a doctor if: ?You think you are having a reaction to the medicine you are taking. ?You have headaches that keep coming back. ?You feel dizzy. ?You have swelling in your ankles. ?You have trouble with your vision. ?Get help right away if: ?You get a very bad headache. ?You start to feel mixed up (confused). ?You feel weak or numb. ?You feel faint. ?You have very bad pain in your: ?Chest. ?Belly (abdomen). ?You vomit more than once. ?You have trouble breathing. ?These symptoms may be an emergency. Get help right away. Call 911. ?Do not wait to see if the symptoms will go away. ?Do not drive yourself to the hospital. ?Summary ?Hypertension is another name for high blood pressure. ?High blood pressure forces your heart to work harder to pump blood. ?For most people, a normal blood pressure is less than 120/80. ?Making healthy choices can help lower blood pressure. If your blood pressure does not get lower with healthy choices, you may need to take medicine. ?This information is not intended to replace advice given to you by your health care provider. Make sure you discuss any questions you have with your health care provider. ?Document Revised: 10/07/2020 Document Reviewed: 10/07/2020 ?Elsevier Patient Education ? 2023 [employer]vier Inc. ? ?

## 2022-02-24 NOTE — Progress Notes (Signed)
Patient presents today for a bp check, patient currently taking amlodipine '10mg'$ s at night , hydrochlorothiazide '25mg'$  in the morning , and valsartan '160mg'$  in the morning. This morning she states eating a bowl of cereal. Along with drinking a whole & half bottle of water. She states when her right foot tends to be in pain her bp readings are higher. When she is relaxed. Her bp readings are low. This morning she reports no pain.  BP Readings from Last 3 Encounters:  02/24/22 122/80  02/07/22 (!) 158/98  01/24/22 (!) 154/92  Per provider patient is to keep up the great work & exercise. She will follow up in July for appointment.

## 2022-02-27 ENCOUNTER — Encounter: Admit: 2022-02-27 | Discharge: 2022-02-27 | Payer: MEDICARE

## 2022-02-27 NOTE — Telephone Encounter
Discussed follow up plan with patient's facility. Patient is arranged for specialty follow ups:    Neurology - [place] 03/23/22  ENT - Dr. Luana Shu 03/29/22    This RN will coordinate follow-up appointment with [facility] to follow.

## 2022-03-09 ENCOUNTER — Other Ambulatory Visit
Admission: RE | Admit: 2022-03-09 | Discharge: 2022-03-09 | Disposition: A | Discharge status: Home / Self Care | Payer: Medicare Other | Source: Ambulatory Visit | Attending: Podiatry | Admitting: Podiatry

## 2022-03-09 DIAGNOSIS — M25571 Pain in right ankle and joints of right foot: Secondary | ICD-10-CM | POA: Insufficient documentation

## 2022-03-09 DIAGNOSIS — Z7901 Long term (current) use of anticoagulants: Secondary | ICD-10-CM | POA: Diagnosis not present

## 2022-03-09 DIAGNOSIS — M65871 Other synovitis and tenosynovitis, right ankle and foot: Secondary | ICD-10-CM | POA: Diagnosis not present

## 2022-03-09 DIAGNOSIS — M76821 Posterior tibial tendinitis, right leg: Secondary | ICD-10-CM | POA: Diagnosis not present

## 2022-03-09 LAB — SYNOVIAL CELL COUNT + DIFF, W/ CRYSTALS
Crystals, Fluid: NONE SEEN
Eosinophils-Synovial: 0 % (ref 0–1)
Lymphocytes-Synovial Fld: 7 % (ref 0–20)
Monocyte-Macrophage-Synovial Fluid: 3 % — ABNORMAL LOW (ref 50–90)
Neutrophil, Synovial: 90 % — ABNORMAL HIGH (ref 0–25)
WBC, Synovial: 884 /mm3 — ABNORMAL HIGH (ref 0–200)

## 2022-03-13 LAB — BODY FLUID CULTURE W GRAM STAIN
Culture: NO GROWTH
Gram Stain: NONE SEEN

## 2022-03-21 ENCOUNTER — Encounter: Admit: 2022-03-21 | Discharge: 2022-03-21 | Payer: MEDICARE

## 2022-03-21 NOTE — Telephone Encounter
Follow up phone call made to coordinate patient's follow up. Patient due for [facility] and clinic visit with Dr. Terance Hart at [facility].   Scheduled patient's imaging and clinic appointment.     Details below:    Called patient's facility and spoke with nursing staff. Informed them of date/times of appointments above, and informed them this RN would fax details along with rescheduling line should this need to be rescheduled. Obtained fax number and sent to patient's facility for coordination of care.

## 2022-03-28 ENCOUNTER — Encounter: Admit: 2022-03-28 | Discharge: 2022-03-28 | Payer: MEDICARE

## 2022-03-28 NOTE — Telephone Encounter
Received call from coordinator at [facility] as follow-up from prior phone encounter with this RN on 03/21/22 in regards to patient's imaging and follow-up appointment in neurosurgery clinic.     Coordinator requesting [place] be done locally and patient be seen via Telehealth. This RN stated imaging could be done locally and Telehealth visit to follow if patient activated MyChart account. MyChart activation link sent to email provided to this RN my coordinator. This RN stated that if patient unable to activate MyChart account, alternate option would be for a zoom call started by this clinic who could then provide patient with meeting ID/passcode. They voiced understanding. Per coordinator, patient's local facility for imaging is [facility]. Informed coordinator this RN would send [facility] order to [facility]. Coordinator will inform this RN of when [facility] is scheduled so our office can facilitate obtaining images/records.    Faxed [facility] order, insurance and demographic info to [facility].

## 2022-03-29 ENCOUNTER — Ambulatory Visit (INDEPENDENT_AMBULATORY_CARE_PROVIDER_SITE_OTHER): Payer: Medicare Other | Admitting: Internal Medicine

## 2022-03-29 ENCOUNTER — Encounter: Payer: Self-pay | Admitting: Internal Medicine

## 2022-03-29 VITALS — BP 130/80 | HR 90 | Temp 98.1°F | Ht 63.0 in | Wt 212.2 lb

## 2022-03-29 DIAGNOSIS — E7800 Pure hypercholesterolemia, unspecified: Secondary | ICD-10-CM | POA: Diagnosis not present

## 2022-03-29 DIAGNOSIS — I10 Essential (primary) hypertension: Secondary | ICD-10-CM | POA: Diagnosis not present

## 2022-03-29 DIAGNOSIS — M79671 Pain in right foot: Secondary | ICD-10-CM | POA: Diagnosis not present

## 2022-03-29 DIAGNOSIS — Z6837 Body mass index (BMI) 37.0-37.9, adult: Secondary | ICD-10-CM

## 2022-03-29 DIAGNOSIS — R7309 Other abnormal glucose: Secondary | ICD-10-CM

## 2022-03-29 NOTE — Patient Instructions (Addendum)
Anti-inflammatory diet--Google list of foods to help decrease inflammation  Take Valsartan in the morning Take amlodipine in the evenings   Gout  Gout is a condition that causes painful swelling of the joints. Gout is a type of inflammation of the joints (arthritis). This condition is caused by having too much uric acid in the body. Uric acid is a chemical that forms when the body breaks down substances called purines. Purines are important for building body proteins. When the body has too much uric acid, sharp crystals can form and build up inside the joints. This causes pain and swelling. Gout attacks can happen quickly and may be very painful (acute gout). Over time, the attacks can affect more joints and become more frequent (chronic gout). Gout can also cause uric acid to build up under the skin and inside the kidneys. What are the causes? This condition is caused by too much uric acid in your blood. This can happen because: Your kidneys do not remove enough uric acid from your blood. This is the most common cause. Your body makes too much uric acid. This can happen with some cancers and cancer treatments. It can also occur if your body is breaking down too many red blood cells (hemolytic anemia). You eat too many foods that are high in purines. These foods include organ meats and some seafood. Alcohol, especially beer, is also high in purines. A gout attack may be triggered by trauma or stress. What increases the risk? The following factors may make you more likely to develop this condition: Having a family history of gout. Being female and middle-aged. Being female and having gone through menopause. Taking certain medicines, including aspirin, cyclosporine, diuretics, levodopa, and niacin. Having an organ transplant. Having certain conditions, such as: Being obese. Lead poisoning. Kidney disease. A skin condition called psoriasis. Other factors include: Losing weight too  quickly. Being dehydrated. Frequently drinking alcohol, especially beer. Frequently drinking beverages that are sweetened with a type of sugar called fructose. What are the signs or symptoms? An attack of acute gout happens quickly. It usually occurs in just one joint. The most common place is the big toe. Attacks often start at night. Other joints that may be affected include joints of the feet, ankle, knee, fingers, wrist, or elbow. Symptoms of this condition may include: Severe pain. Warmth. Swelling. Stiffness. Tenderness. The affected joint may be very painful to touch. Shiny, red, or purple skin. Chills and fever. Chronic gout may cause symptoms more frequently. More joints may be involved. You may also have white or yellow lumps (tophi) on your hands or feet or in other areas near your joints. How is this diagnosed? This condition is diagnosed based on your symptoms, your medical history, and a physical exam. You may have tests, such as: Blood tests to measure uric acid levels. Removal of joint fluid with a thin needle (aspiration) to look for uric acid crystals. X-rays to look for joint damage. How is this treated? Treatment for this condition has two phases: treating an acute attack and preventing future attacks. Acute gout treatment may include medicines to reduce pain and swelling, including: NSAIDs, such as ibuprofen. Steroids. These are strong anti-inflammatory medicines that can be taken by mouth (orally) or injected into a joint. Colchicine. This medicine relieves pain and swelling when it is taken soon after an attack. It can be given by mouth or through an IV. Preventive treatment may include: Daily use of smaller doses of NSAIDs or colchicine. Use of  a medicine that reduces uric acid levels in your blood, such as allopurinol. Changes to your diet. You may need to see a dietitian about what to eat and drink to prevent gout. Follow these instructions at home: During a  gout attack  If directed, put ice on the affected area. To do this: Put ice in a plastic bag. Place a towel between your skin and the bag. Leave the ice on for 20 minutes, 2-3 times a day. Remove the ice if your skin turns bright red. This is very important. If you cannot feel pain, heat, or cold, you have a greater risk of damage to the area. Raise (elevate) the affected joint above the level of your heart as often as possible. Rest the joint as much as possible. If the affected joint is in your leg, you may be given crutches to use. Follow instructions from your health care provider about eating or drinking restrictions. Avoiding future gout attacks Follow a low-purine diet as told by your dietitian or health care provider. Avoid foods and drinks that are high in purines, including liver, kidney, anchovies, asparagus, herring, mushrooms, mussels, and beer. Maintain a healthy weight or lose weight if you are overweight. If you want to lose weight, talk with your health care provider. Do not lose weight too quickly. Start or maintain an exercise program as told by your health care provider. Eating and drinking Avoid drinking beverages that contain fructose. Drink enough fluids to keep your urine pale yellow. If you drink alcohol: Limit how much you have to: 0-1 drink a day for women who are not pregnant. 0-2 drinks a day for men. Know how much alcohol is in a drink. In the [place] one drink equals one 12 oz bottle of beer (355 mL), one 5 oz glass of wine (148 mL), or one 1 oz glass of hard liquor (44 mL). General instructions Take over-the-counter and prescription medicines only as told by your health care provider. Ask your health care provider if the medicine prescribed to you requires you to avoid driving or using machinery. Return to your normal activities as told by your health care provider. Ask your health care provider what activities are safe for you. Keep all follow-up visits. This  is important. Where to find more information [employer] [facility] [employer]: www.niams.SouthExposed.es Contact a health care provider if you have: Another gout attack. Continuing symptoms of a gout attack after 10 days of treatment. Side effects from your medicines. Chills or a fever. Burning pain when you urinate. Pain in your lower back or abdomen. Get help right away if you: Have severe or uncontrolled pain. Cannot urinate. Summary Gout is painful swelling of the joints caused by having too much uric acid in the body. The most common site for gout to occur is in the big toe, but it can affect other joints in the body. Medicines and dietary changes can help to prevent and treat gout attacks. This information is not intended to replace advice given to you by your health care provider. Make sure you discuss any questions you have with your health care provider. Document Revised: 09/22/2020 Document Reviewed: 09/22/2020 Elsevier Patient Education  [place].

## 2022-03-29 NOTE — Progress Notes (Signed)
Barnet Glasgow Martin,acting as a Education administrator for [name], MD.,have documented all relevant documentation on the behalf of [name], MD,as directed by  [name], MD while in the presence of [name], MD.    Subjective:     Patient ID: Carolyn Baker , female    DOB: May 14, 1955 , 67 y.o.   MRN: [id number]   Chief Complaint  Patient presents with   Hypertension   Hyperlipidemia    HPI  Patient presents today for bp check & chol follow up. Patient reports compliance with most meds. Patient states her podiatrist told her the HCTZ was making her foot swell. She denies prior h/o gout. Since she stopped the medication two weeks ago, she has no longer having symptoms.     BP Readings from Last 3 Encounters: 03/29/22 : 130/80 02/24/22 : 122/80 02/07/22 : (!) 158/98    Hypertension This is a chronic problem. The current episode started more than 1 year ago. The problem has been gradually improving since onset. The problem is controlled. Pertinent negatives include no blurred vision, chest pain, palpitations or shortness of breath. The current treatment provides moderate improvement. Compliance problems include exercise.   Hyperlipidemia Pertinent negatives include no chest pain or shortness of breath.     Past Medical History:  Diagnosis Date   Hypertension      Family History  Problem Relation Age of Onset   Stroke Mother    Dementia Mother    Prostate cancer Father    Breast cancer Maternal Aunt      Current Outpatient Medications:    amLODipine (NORVASC) 10 MG tablet, Take 1 tablet (10 mg total) by mouth daily., Disp: 90 tablet, Rfl: 1   atorvastatin (LIPITOR) 20 MG tablet, Take 1 tablet (20 mg total) by mouth daily., Disp: 90 tablet, Rfl: 1   Cholecalciferol (VITAMIN D3) 125 MCG (5000 UT) CAPS, Take by mouth., Disp: , Rfl:    nystatin cream (MYCOSTATIN), Apply 1 Application topically 2 (two) times daily. Please add use as needed, Disp: 45 g, Rfl: 1    valsartan (DIOVAN) 160 MG tablet, Take 1 tablet (160 mg total) by mouth daily., Disp: 90 tablet, Rfl: 2   No Known Allergies   Review of Systems  Constitutional: Negative.   Eyes:  Negative for blurred vision.  Respiratory: Negative.  Negative for shortness of breath.   Cardiovascular: Negative.  Negative for chest pain and palpitations.  Gastrointestinal: Negative.   Musculoskeletal: Negative.   Neurological: Negative.   Psychiatric/Behavioral: Negative.       Today's Vitals   03/29/22 0925  BP: 130/80  Pulse: 90  Temp: 98.1 F (36.7 C)  TempSrc: Oral  Weight: 212 lb 3.2 oz (96.3 kg)  Height: 5\' 3"  (1.6 m)  PainSc: 0-No pain   Body mass index is 37.59 kg/m.  Wt Readings from Last 3 Encounters:  03/29/22 212 lb 3.2 oz (96.3 kg)  02/24/22 211 lb (95.7 kg)  02/07/22 215 lb (97.5 kg)   BP Readings from Last 3 Encounters:  03/29/22 130/80  02/24/22 122/80  02/07/22 (!) 158/98    Objective:  Physical Exam Vitals and nursing note reviewed.  Constitutional:      Appearance: Normal appearance. She is obese.  HENT:     Head: Normocephalic and atraumatic.     Nose:     Comments: Masked     Mouth/Throat:     Comments: Masked  Eyes:     Extraocular Movements: Extraocular  movements intact.  Cardiovascular:     Rate and Rhythm: Normal rate and regular rhythm.     Heart sounds: Normal heart sounds.  Pulmonary:     Effort: Pulmonary effort is normal.     Breath sounds: Normal breath sounds.  Musculoskeletal:     Cervical back: Normal range of motion.  Skin:    General: Skin is warm.  Neurological:     General: No focal deficit present.     Mental Status: She is alert.  Psychiatric:        Mood and Affect: Mood normal.        Behavior: Behavior normal.      Assessment And Plan:     1. Essential hypertension, benign Comments: Chronic, fair control.  She will c/w valsartan and amlodipine daily. I will d/c HCTZ  per her request. She will f/u in 4 months. -  BMP8+eGFR  2. Pure hypercholesterolemia Comments: Chronic, she is now on atorvastatin. She is encouraged to follow a heart healthy lifestyle. - Lipid panel - ALT  3. Other abnormal glucose Comments: Previous labs reviewed, a1c has been elevated in the past.  I will recheck an a1c today. - Hemoglobin A1c  4. Right foot pain Comments: Podiatry note reviewed. She is not sxic today since she has stopped the HCTZ. I will check uric acid level today. - Uric acid - Sed Rate (ESR)  5. Class 2 severe obesity due to excess calories with serious comorbidity and body mass index (BMI) of 37.0 to 37.9 in adult [facility]) Comments: She is encouraged to aim for at least 150 minutes of exercise/week, while striving for BMI<30 to decrease cardiac risk.     Patient was given opportunity to ask questions. Patient verbalized understanding of the plan and was able to repeat key elements of the plan. All questions were answered to their satisfaction.   I, [name], MD, have reviewed all documentation for this visit. The documentation on 03/29/22 for the exam, diagnosis, procedures, and orders are all accurate and complete.   IF YOU HAVE BEEN REFERRED TO A SPECIALIST, IT MAY TAKE 1-2 WEEKS TO SCHEDULE/PROCESS THE REFERRAL. IF YOU HAVE NOT HEARD FROM US/SPECIALIST IN TWO WEEKS, PLEASE GIVE [place] A CALL AT [phone].   THE PATIENT IS ENCOURAGED TO PRACTICE SOCIAL DISTANCING DUE TO THE COVID-19 PANDEMIC.

## 2022-03-30 LAB — SEDIMENTATION RATE: Sed Rate: 6 mm/hr (ref 0–40)

## 2022-03-30 LAB — URIC ACID: Uric Acid: 6.7 mg/dL (ref 3.0–7.2)

## 2022-03-30 LAB — LIPID PANEL
Chol/HDL Ratio: 2.4 ratio (ref 0.0–4.4)
Cholesterol, Total: 131 mg/dL (ref 100–199)
HDL: 54 mg/dL (ref >39)
LDL Chol Calc (NIH): 64 mg/dL (ref 0–99)
Triglycerides: 62 mg/dL (ref 0–149)
VLDL Cholesterol Cal: 13 mg/dL (ref 5–40)

## 2022-03-30 LAB — BMP8+EGFR
BUN/Creatinine Ratio: 18 (ref 12–28)
BUN: 17 mg/dL (ref 8–27)
CO2: 22 mmol/L (ref 20–29)
Calcium: 9.6 mg/dL (ref 8.7–10.3)
Chloride: 104 mmol/L (ref 96–106)
Creatinine, Ser: 0.97 mg/dL (ref 0.57–1.00)
Glucose: 93 mg/dL (ref 70–99)
Potassium: 4.4 mmol/L (ref 3.5–5.2)
Sodium: 142 mmol/L (ref 134–144)
eGFR: 65 mL/min/{1.73_m2} (ref >59)

## 2022-03-30 LAB — HEMOGLOBIN A1C
Est. average glucose Bld gHb Est-mCnc: 128 mg/dL
Hgb A1c MFr Bld: 6.1 % — ABNORMAL HIGH (ref 4.8–5.6)

## 2022-03-30 LAB — ALT: ALT: 37 IU/L — ABNORMAL HIGH (ref 0–32)

## 2022-04-18 ENCOUNTER — Ambulatory Visit
Admission: RE | Admit: 2022-04-18 | Discharge: 2022-04-18 | Disposition: A | Discharge status: Home / Self Care | Payer: Medicare Other | Source: Ambulatory Visit | Attending: Internal Medicine | Admitting: Internal Medicine

## 2022-04-18 ENCOUNTER — Other Ambulatory Visit: Payer: Self-pay | Admitting: Internal Medicine

## 2022-04-18 DIAGNOSIS — E2839 Other primary ovarian failure: Secondary | ICD-10-CM

## 2022-04-18 DIAGNOSIS — Z1231 Encounter for screening mammogram for malignant neoplasm of breast: Secondary | ICD-10-CM

## 2022-04-18 DIAGNOSIS — Z780 Asymptomatic menopausal state: Secondary | ICD-10-CM | POA: Diagnosis not present

## 2022-05-02 ENCOUNTER — Encounter: Admit: 2022-05-02 | Discharge: 2022-05-02 | Payer: MEDICARE

## 2022-05-02 ENCOUNTER — Ambulatory Visit: Admit: 2022-05-02 | Discharge: 2022-05-03 | Payer: MEDICARE

## 2022-05-02 DIAGNOSIS — G919 Hydrocephalus, unspecified: Secondary | ICD-10-CM

## 2022-05-02 NOTE — Patient Instructions
It was a pleasure seeing you today in the Neurosurgery Clinic.   We have placed orders for a high-volume lumbar puncture with pre and post physical therapy assessment.     If you have any questions or concerns, please don't hesitate to call me at [phone] or send a message via MyChart.  Thank you and take care!     Mertie Clause RN, BSN  Clinical Nurse Coordinator for Dr. Consuela Mimes   [facility]  Phone: [phone] - Fax: [phone]       Communication: Our preferred method of communication is through MyChart messages. You may also call my direct RN line for questions and/or concerns at [phone].  Additional Phone Numbers:  For urgent concerns after hours, on weekends and/or holidays, you may contact [phone] and ask for the doctor on-call for neurosurgery.  For clinic appointment scheduling, call  [phone], select option 1.  For radiology scheduling, please call [phone].  For MyChart assistance, please call [phone].

## 2022-05-04 ENCOUNTER — Encounter: Admit: 2022-05-04 | Discharge: 2022-05-04 | Payer: MEDICARE

## 2022-05-04 DIAGNOSIS — I10 Essential (primary) hypertension: Secondary | ICD-10-CM

## 2022-05-04 DIAGNOSIS — J3089 Other allergic rhinitis: Secondary | ICD-10-CM

## 2022-05-04 DIAGNOSIS — D696 Thrombocytopenia, unspecified: Secondary | ICD-10-CM

## 2022-05-04 DIAGNOSIS — E876 Hypokalemia: Secondary | ICD-10-CM

## 2022-05-04 DIAGNOSIS — J410 Simple chronic bronchitis: Secondary | ICD-10-CM

## 2022-05-04 DIAGNOSIS — R600 Localized edema: Secondary | ICD-10-CM

## 2022-05-04 DIAGNOSIS — I679 Cerebrovascular disease, unspecified: Secondary | ICD-10-CM

## 2022-05-04 DIAGNOSIS — G40909 Epilepsy, unspecified, not intractable, without status epilepticus: Secondary | ICD-10-CM

## 2022-05-04 DIAGNOSIS — T502X5A Adverse effect of carbonic-anhydrase inhibitors, benzothiadiazides and other diuretics, initial encounter: Secondary | ICD-10-CM

## 2022-05-04 DIAGNOSIS — E119 Type 2 diabetes mellitus without complications: Secondary | ICD-10-CM

## 2022-05-04 DIAGNOSIS — E039 Hypothyroidism, unspecified: Secondary | ICD-10-CM

## 2022-05-04 DIAGNOSIS — F419 Anxiety disorder, unspecified: Secondary | ICD-10-CM

## 2022-05-04 DIAGNOSIS — G912 (Idiopathic) normal pressure hydrocephalus: Secondary | ICD-10-CM

## 2022-05-04 DIAGNOSIS — K219 Gastro-esophageal reflux disease without esophagitis: Secondary | ICD-10-CM

## 2022-05-04 DIAGNOSIS — G44221 Chronic tension-type headache, intractable: Secondary | ICD-10-CM

## 2022-05-04 DIAGNOSIS — J449 Chronic obstructive pulmonary disease, unspecified: Secondary | ICD-10-CM

## 2022-05-04 DIAGNOSIS — R519 Headache, unspecified: Secondary | ICD-10-CM

## 2022-05-04 DIAGNOSIS — F0393 Unspecified dementia, unspecified severity, with mood disturbance (HCC): Secondary | ICD-10-CM

## 2022-05-04 DIAGNOSIS — Z96651 Presence of right artificial knee joint: Secondary | ICD-10-CM

## 2022-05-04 NOTE — Progress Notes
Interventional Radiology Outpatient Scheduling Checklist      1.  Name of Procedure(s):   LP w NPH study      2.  Date of Procedure:   05/25/22      3.  Arrival Time:   0900      4.  Procedure Time:  1100      5.  Correct Procedural Room Assignment:  [facility]      6.  Blood Thinners Triaged and instructed per protocol: Y/N/NA:  None  Confirmed accurate instructions sent to patient: Y/N:  NA       7.  Procedure Order Verified: Y/N:  Yes    8.  Patient instructed to have a driver: Y/N/NA:  Yes    9.  Patient instructed on NPO status: Y/N/NA:  na  Confirmed accurate instructions sent to patient: Y/N:  Yes    10.  Specimen needed: Y/N/NA:  Yes   Verified Order placed: Y/N:  Yes    11.  Allergies Verified:  Y/N:  Yes    12.  Has the patient ever had a procedure with contrast (heart cath, CT scan, IVP, etc)? y    24. Has the patient ever had an adverse reaction to iodine or iodinated contrast, including but not limited to trouble breathing, swelling around the face or throat, itching, or rash? n    14. Has the allergy list been updated? na    15.  Does the patient have labs according to IR Pre-procedure Laboratory Parameter policy: Y/N/NA:  na  If No, was the patient instructed to obtain labs prior to procedure: Y/N/NA:  NA     16.  Will the patient need to be admitted or have a possible admission: Y/N:  No  If yes, confirmed accurate instructions sent to patient: Y/N/NA:  NA     17.  Patient States Understanding: Y/N:  Scheduled by RN Efraim Kaufmann at [facility] nursing center [phone] fax [phone]    18.  History of OSA:  Y/N:  No  If yes, confirm request to bring CPAP sent to patient: Y/N/NA:  NA    19. Does the patient have an insulin pump or continuous glucose monitor? N   If yes, was the patient instructed that this will need to be removed for this procedure and to bring supplies to reapply once the procedure is complete? Y/N    20. Patient was sent electronic procedure instructions: Y/N:  Yes by fax

## 2022-05-04 NOTE — Patient Education
Dear Catherine Romero,    Thank you for choosing [facility] Interventional Radiology for your procedure. Your appointment information is listed below:    Appointment Date: 05/25/22  Arrival Time: Please arrive for Check-in at: 9 AM  Appointment Time: 11 AM    A member of the Physical Therapy Team will meet you outside of Interventional radiology department on the 2nd floor after you are registered    Location:   [employer]: [address] [place], [place]  [address]  Parking: [address] Parking Garage    INTERVENTIONAL RADIOLOGY  PRE-PROCEDURE INSTRUCTIONS LOCAL    You are scheduled for a procedure in Interventional Radiology.  Please follow these instructions and any direction from your Primary Care/Managing Physician.  If you have questions about your procedure or need to reschedule please call [phone].      Medication Instructions:  Continue scheduled medication.       Diet Instructions: Maintain regular diet with no restrictions.     Day of Exam Instructions:  Bathe or shower with an antibacterial soap prior to your appointment.  Bring a list of your current medications and the dosages.  Wear comfortable clothing and leave valuables at home.  Arrive (1) hour prior to your appointment.  This time will be spent registering, interviewing, assessing, educating, and preparing you for the test.  You will be with [place] anywhere from 30 minutes to 2 hours after your exam depending on your procedure.  Depending on the procedure and as instructed by the nurse a responsible adult may be required to drive you home (no Benedetto Goad, taxis or buses are allowed). If a driver is required and you do not have one  we will be unable to perform your procedure.   The nurse will give you discharge instructions about your care and activities after the procedure.

## 2022-05-04 NOTE — Telephone Encounter
Per [facility], patient has not seen ENT provider at their clinic. She is scheduled for 10/2022.

## 2022-05-24 ENCOUNTER — Encounter: Admit: 2022-05-24 | Discharge: 2022-05-24 | Payer: MEDICARE

## 2022-05-25 ENCOUNTER — Encounter: Admit: 2022-05-25 | Discharge: 2022-05-25 | Payer: MEDICARE

## 2022-05-25 ENCOUNTER — Ambulatory Visit: Admit: 2022-05-25 | Discharge: 2022-05-25 | Payer: MEDICARE

## 2022-05-25 DIAGNOSIS — G919 Hydrocephalus, unspecified: Secondary | ICD-10-CM

## 2022-05-25 DIAGNOSIS — J449 Chronic obstructive pulmonary disease, unspecified: Secondary | ICD-10-CM

## 2022-05-25 DIAGNOSIS — F419 Anxiety disorder, unspecified: Secondary | ICD-10-CM

## 2022-05-25 DIAGNOSIS — J3089 Other allergic rhinitis: Secondary | ICD-10-CM

## 2022-05-25 DIAGNOSIS — R519 Headache, unspecified: Secondary | ICD-10-CM

## 2022-05-25 DIAGNOSIS — G912 (Idiopathic) normal pressure hydrocephalus: Secondary | ICD-10-CM

## 2022-05-25 DIAGNOSIS — I679 Cerebrovascular disease, unspecified: Secondary | ICD-10-CM

## 2022-05-25 DIAGNOSIS — R600 Localized edema: Secondary | ICD-10-CM

## 2022-05-25 DIAGNOSIS — Z96651 Presence of right artificial knee joint: Secondary | ICD-10-CM

## 2022-05-25 DIAGNOSIS — I10 Essential (primary) hypertension: Secondary | ICD-10-CM

## 2022-05-25 DIAGNOSIS — J410 Simple chronic bronchitis: Secondary | ICD-10-CM

## 2022-05-25 DIAGNOSIS — E876 Hypokalemia: Secondary | ICD-10-CM

## 2022-05-25 DIAGNOSIS — T502X5A Adverse effect of carbonic-anhydrase inhibitors, benzothiadiazides and other diuretics, initial encounter: Secondary | ICD-10-CM

## 2022-05-25 DIAGNOSIS — D696 Thrombocytopenia, unspecified: Secondary | ICD-10-CM

## 2022-05-25 DIAGNOSIS — F0393 Unspecified dementia, unspecified severity, with mood disturbance (HCC): Secondary | ICD-10-CM

## 2022-05-25 DIAGNOSIS — E039 Hypothyroidism, unspecified: Secondary | ICD-10-CM

## 2022-05-25 DIAGNOSIS — E119 Type 2 diabetes mellitus without complications: Secondary | ICD-10-CM

## 2022-05-25 DIAGNOSIS — G40909 Epilepsy, unspecified, not intractable, without status epilepticus: Secondary | ICD-10-CM

## 2022-05-25 DIAGNOSIS — K219 Gastro-esophageal reflux disease without esophagitis: Secondary | ICD-10-CM

## 2022-05-25 DIAGNOSIS — G44221 Chronic tension-type headache, intractable: Secondary | ICD-10-CM

## 2022-05-25 LAB — TOTAL PROTEIN-CSF: CSF TOTAL PROTEIN: 67 mg/dL — ABNORMAL HIGH (ref 15–45)

## 2022-05-25 LAB — GLUCOSE-CSF: CSF GLUCOSE: 117 mg/dL — ABNORMAL HIGH (ref 40–75)

## 2022-05-25 LAB — POC GLUCOSE: POC GLUCOSE: 189 mg/dL — ABNORMAL HIGH (ref 70–100)

## 2022-05-25 MED ORDER — ACETAMINOPHEN 325 MG PO TAB
650 mg | Freq: Once | ORAL | 0 refills | Status: CP
Start: 2022-05-25 — End: ?
  Administered 2022-05-25: 17:00:00 650 mg via ORAL

## 2022-05-25 NOTE — H&P (View-Only)
IR Pre-Procedure History and Physical/Sedation Plan    Procedure Date: 05/25/2022     Planned Procedure(s):  Lumbar puncture     Indication:  Eval for hydrocephalus   __________________________________________________________________    Chief Complaint:  Gait and memory issues     History of Present Illness: CORDILIA RHEAULT is a 67 y.o. female with a history as listed below who presents today for procedure.    There are no problems to display for this patient.    Past Medical History:   Diagnosis Date    (Idiopathic) normal pressure hydrocephalus (HCC)     Adverse effect of carbonic-anhydrase inhibitors, benzothiadiazides and other diuretics, initial encounter     Anxiety disorder, unspecified     Cerebrovascular disease, unspecified     Chronic obstructive pulmonary disease, unspecified (HCC)     Chronic tension-type headache, intractable     Epilepsy, unspecified, not intractable, without status epilepticus (HCC)     Essential (primary) hypertension     Gastro-esophageal reflux disease without esophagitis     Headache, unspecified     Hypokalemia     Hypothyroidism, unspecified     Localized edema     Other allergic rhinitis     Presence of right artificial knee joint     Simple chronic bronchitis (HCC)     Thrombocytopenia, unspecified (HCC)     Type 2 diabetes mellitus without complication (HCC)     Unspecified dementia, unspecified severity, with mood disturbance (HCC)       History reviewed. No pertinent surgical history.   Social History     Tobacco Use    Smoking status: Former     Types: Cigarettes    Smokeless tobacco: Never   Substance Use Topics    Alcohol use: Not Currently      No family history on file.   Medications Prior to Admission   Medication Sig Dispense Refill Last Dose    acetaminophen (TYLENOL EXTRA STRENGTH) 500 mg tablet Take one tablet by mouth every 6 hours as needed for Pain. Max of 4,000 mg of acetaminophen in 24 hours.       ARIPiprazole (ABILIFY) 5 mg tablet Take one tablet by mouth daily.       aspirin EC (ASPIR-LOW) 81 mg tablet Take one tablet by mouth daily.       FLONASE ALLERGY RELIEF 50 mcg/actuation nasal spray, suspension Apply two sprays into nose as directed every 24 hours.       LASIX 20 mg tablet Take one tablet by mouth every morning.       levocetirizine (XYZAL) 5 mg tablet Take one tablet by mouth.       levothyroxine (SYNTHROID) 75 mcg tablet Take one tablet by mouth.       melatonin (MELATIN) 3 mg tablet Take one tablet by mouth nightly as needed.       metFORMIN (GLUCOPHAGE) 1,000 mg tablet Take one tablet by mouth daily with breakfast.       montelukast (SINGULAIR) 10 mg tablet Take one tablet by mouth at bedtime daily.       NURTEC ODT 75 mg rapid dissolve tablet Dissolve one tablet by mouth daily as needed.       omeprazole DR (PRILOSEC) 20 mg capsule Take one capsule by mouth daily before breakfast.       ondansetron HCL (ZOFRAN) 4 mg tablet Take one tablet by mouth.       potassium chloride (KLOR-CON) 20 mEq packet Take one packet by mouth  daily.       SEROQUEL 25 mg tablet Take one tablet by mouth every 12 hours.       topiramate (TOPAMAX) 100 mg tablet Take one tablet by mouth daily.       traZODone (DESYREL) 100 mg tablet Take one tablet by mouth.       traZODone (DESYREL) 150 mg tablet Take one tablet by mouth.       venlafaxine (EFFEXOR) 75 mg tablet Take one tablet by mouth three times daily with meals.        Allergies   Allergen Reactions    Ciprofloxacin      Allergy recorded in SMS: Cipro~Reactions: RASH    Penicillins      Allergy recorded in SMS: PCN~Reactions: RASH    Hydrocodone-Acetaminophen      Allergy recorded in SMS: VICODIN    Metronidazole      Allergy recorded in SMS: FLAGYL~Reactions: RASH       Review of Systems  Review of systems not obtained due to patient factors.    Physical Exam:  Vital Signs: Last Filed In 24 Hours Vital Signs: 24 Hour Range                General appearance: Alert and no distress noted.  Neurologic: Alert, pleasantly confused  Lungs: Non labored at rest  Heart: Regular rate and rhythm  Abdomen: Non-distended    Pre-procedure anxiolysis plan: N/A  Sedation/Medication Plan: Local anesthetic  Personal history of sedation complications: Denies adverse event.   Family history of sedation complications: Denies adverse event.   Medications for Reversal: NA  Discussion/Reviews:  Physician has discussed risks and alternatives of this type of sedation and above planned procedures with patient and family     NPO Status: NA  Airway:  NA  Head and Neck: NA  Mouth: NA   Pregnancy Status: Not Pregnant    Lab/Radiology/Other Diagnostic Tests:  Labs:  No pertinent labs           Brad Mcgaughy P Divine-Thiele, APRN-NP  Pager [phone]

## 2022-05-25 NOTE — Progress Notes
Pt recovery complete.     Ambulatory status: Wheelchair- Assisted Transfers.   Vitals stable. Dressing is clean, dry, and intact.  Pain level returned to baseline.  Discharge instructions reviewed with: RN at the facility  AVS signed and provided to patient.    Pt discharged to main lobby via wheelchair transport.

## 2022-05-25 NOTE — Progress Notes
Pt arrived to IR pre/post for procedure work up.     Mobility status: Wheelchair- Assisted Transfers  Procedure and discharge instructions reviewed and pt verbalizes understanding.   Pt cart locked in low position, call light within reach.   See doc flowsheets for further details.    Will continue to monitor patient.

## 2022-05-25 NOTE — Telephone Encounter
Received voicemail 05/24/22 from patient's facility, [facility]). Melissa calls with question about NPO status prior to procedure. Scheduled for high vol LP 05/25/22.    Due to time of call, this RN called back next morning 05/25/22 (patient present in IR) after reviewing chart. See patient education from IR nursing below. This RN informed Melissa of instructions below. Appreciated callback. Denied questions/concerns.

## 2022-05-25 NOTE — Progress Notes
PHYSICAL THERAPY  NOTE      Name: Catherine Romero   MRN: [id number]     DOB: [birth year]      Age: 67 y.o.  Admission Date: 05/25/2022     LOS: 0 days     Date of Service: 05/25/2022      NPH Assessment     Pre-High Volume LP Assessment Post High Volume LP Assessment   Functional assessment     Transfers:   Level of assist needed    (and/or time needed to complete transfer)      Minimal assist with RW, stands in 1-2 attempts, must use UEs on walker to stand.   Able to stand without use of UE on first attempt, but prefers to pull up on walker.  Able to stand on first attempt each time.   Gait description:  Level of assist needed/  Assistive device used     Patient ambulates with shuffling gait, decreased foot clearance and step length with RW.  Has moderate pathway deviations.       Demonstrates improved step length and foot clearance intermittently.  Continues to use roller walker for pathway deviations and balance impairment.       Timed Up and Go (TUG)  (Time in Seconds)     Trial 1 43.46 27.72   Trial 2 56.62 29.68   Trial 3 45.05 30.86   Average of 3 trials: 48.37 29.42   Percentage of change from pre to post 39% improvement     Tinetti Gait and Balance assessment     Gait Score 4/12 5/12   Balance Score 7/16 12/16   Gait and Balance Score    <19 high risk for falling  19-24 greater chance for falling but not high risk   11/28 17/28   Number of steps needed to complete a 360-degree turn   Could not perform, unable to process directions on multiple attempts     22 steps   Montreal Cognitive Assessment (MoCA)   Total Score 4/30 4/30   Summary:    Patient demonstrates improvement in TUG and Tinetti balance scores.              Therapist: Jeanette Caprice, PT  Board Certified Clinical Specialist in Neurologic Physical Therapy  Date: 05/25/2022  [place]: [phone]

## 2022-06-01 ENCOUNTER — Encounter: Admit: 2022-06-01 | Discharge: 2022-06-01 | Payer: MEDICARE

## 2022-06-01 NOTE — Telephone Encounter
Spoke with Efraim Kaufmann, coordinator at [facility], to schedule patient for visit with Dr. Vonita Moss via Zoom call to discuss 05/25/22 LP results and tx options. Confirmed appt details with Melissa. Will provide meeting ID/passcode at time of appt start on 06/06/22.

## 2022-06-06 ENCOUNTER — Encounter: Admit: 2022-06-06 | Discharge: 2022-06-06 | Payer: MEDICARE

## 2022-06-06 NOTE — Telephone Encounter
Called [facility] facility and spoke with Tresa Endo who is Hila's nurse assisting with the appointment scheduled for today. Schae and her daughter Herbert Seta were present for the call as well. Notified them that Dr. Vonita Moss did get called into an emergent case and that Lyrical's appointment needs to be rescheduled to a different day. Herbert Seta had some additional questions about this appointment. It was Heather's understanding that this appointment today was important and that she needed to be present for the telehealth. She was confused as to why this appointment needs to be rescheduled if it is important. She also wanted to know what this appointment was for. Explained that the appointment is to discuss her mom's LP that was done 05/25/22 and to discuss further options for treatment. Herbert Seta has more questions and provided a call back number for someone to reach out to her and answer her questions. Heather expressed understanding to the rescheduling of Elleana's appointment.

## 2022-06-06 NOTE — Telephone Encounter
Spoke with patient's daughter after clinic visit was r/s due to provider conflict. Patient's daughter states she was told by the nursing home it was crucial she was present for her mother's Telehealth visit today and then the appointment ended up getting rescheduled despite the sense of urgency she was given about this appointment.     This RN ensured patient's daughter that patient's pre/post PT assessments following high volume LP showed significant improvement and Dr. Vonita Moss wanted to meet with patient/family via Telehealth to discuss these results and potential treatment options. Educated patient's daughter on this and explained that high volume LP is an assessment tool used to assess patients with hydrocephalus, and this procedure does provide relief although it is temporary as this CSF re accumulates quickly naturally. Patient's daughter appreciated explanation and understood need for r/s due to provider conflict. Confirmed upcoming appointment details. Offered apology for inconveniences caused by rescheduling today 06/06/22. Patient's daughter appreciated phone call.

## 2022-06-12 ENCOUNTER — Encounter: Admit: 2022-06-12 | Discharge: 2022-06-12 | Payer: MEDICARE

## 2022-06-12 ENCOUNTER — Ambulatory Visit: Admit: 2022-06-12 | Discharge: 2022-06-13 | Payer: MEDICARE

## 2022-06-12 ENCOUNTER — Inpatient Hospital Stay: Admit: 2022-06-12 | Discharge: 2022-06-12 | Payer: MEDICARE

## 2022-06-12 DIAGNOSIS — G919 Hydrocephalus, unspecified: Secondary | ICD-10-CM

## 2022-06-12 DIAGNOSIS — G912 (Idiopathic) normal pressure hydrocephalus: Secondary | ICD-10-CM

## 2022-06-12 NOTE — Patient Instructions
It was a pleasure seeing you today in the Neurosurgery Clinic! Dr. Vonita Moss would like to schedule you for surgery. Dr. Enos Fling Administrative Assistant will be in touch to schedule your surgery. Below is some brief information, but keep in mind, these vary patient to patient!    Pre-Anesthesia Clinic [facility]):  After your surgery is scheduled, you will have a Pre-Anesthesia Clinic appointment to discuss necessary testing, lab work and medication changes if there are any.   Then, the afternoon before surgery, between 2:00 - 4:00pm, you will be called to discuss check-in time, surgery location, diet instructions and any additional day-of-surgery instructions.     Expected clinic follow up:   You will be set up for 2 post-operative appointments.   At 2 - 3 weeks, you will see our Nurse Practitioner, and at 6 - 8 weeks, you will see your surgeon, Dr. Vonita Moss.    Should you have any further questions, please send me a message via MyChart or call me at the number below.    Rayfield Citizen, RN, BSN  Clinical Nurse Coordinator for Dr. Consuela Mimes   [facility]  Phone: [phone] - Fax: [phone]

## 2022-06-13 ENCOUNTER — Encounter: Admit: 2022-06-13 | Discharge: 2022-06-13 | Payer: MEDICARE

## 2022-06-13 NOTE — Telephone Encounter
I spoke to [place], Charity fundraiser, with patient's care facility.  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

## 2022-06-27 ENCOUNTER — Encounter: Admit: 2022-06-27 | Discharge: 2022-06-27 | Payer: MEDICARE

## 2022-06-27 ENCOUNTER — Ambulatory Visit: Admit: 2022-06-27 | Discharge: 2022-06-27 | Payer: MEDICARE

## 2022-06-27 DIAGNOSIS — I679 Cerebrovascular disease, unspecified: Secondary | ICD-10-CM

## 2022-06-27 DIAGNOSIS — F0393 Unspecified dementia, unspecified severity, with mood disturbance (HCC): Secondary | ICD-10-CM

## 2022-06-27 DIAGNOSIS — D696 Thrombocytopenia, unspecified: Secondary | ICD-10-CM

## 2022-06-27 DIAGNOSIS — R519 Headache, unspecified: Secondary | ICD-10-CM

## 2022-06-27 DIAGNOSIS — G912 (Idiopathic) normal pressure hydrocephalus: Secondary | ICD-10-CM

## 2022-06-27 DIAGNOSIS — J449 Chronic obstructive pulmonary disease, unspecified: Secondary | ICD-10-CM

## 2022-06-27 DIAGNOSIS — K219 Gastro-esophageal reflux disease without esophagitis: Secondary | ICD-10-CM

## 2022-06-27 DIAGNOSIS — G44221 Chronic tension-type headache, intractable: Secondary | ICD-10-CM

## 2022-06-27 DIAGNOSIS — R600 Localized edema: Secondary | ICD-10-CM

## 2022-06-27 DIAGNOSIS — I10 Essential (primary) hypertension: Secondary | ICD-10-CM

## 2022-06-27 DIAGNOSIS — Z01818 Encounter for other preprocedural examination: Secondary | ICD-10-CM

## 2022-06-27 DIAGNOSIS — F419 Anxiety disorder, unspecified: Secondary | ICD-10-CM

## 2022-06-27 DIAGNOSIS — G40909 Epilepsy, unspecified, not intractable, without status epilepticus: Secondary | ICD-10-CM

## 2022-06-27 DIAGNOSIS — E039 Hypothyroidism, unspecified: Secondary | ICD-10-CM

## 2022-06-27 DIAGNOSIS — T502X5A Adverse effect of carbonic-anhydrase inhibitors, benzothiadiazides and other diuretics, initial encounter: Secondary | ICD-10-CM

## 2022-06-27 DIAGNOSIS — Z96651 Presence of right artificial knee joint: Secondary | ICD-10-CM

## 2022-06-27 DIAGNOSIS — E119 Type 2 diabetes mellitus without complications: Secondary | ICD-10-CM

## 2022-06-27 DIAGNOSIS — E876 Hypokalemia: Secondary | ICD-10-CM

## 2022-06-27 DIAGNOSIS — J3089 Other allergic rhinitis: Secondary | ICD-10-CM

## 2022-06-27 DIAGNOSIS — J410 Simple chronic bronchitis: Secondary | ICD-10-CM

## 2022-06-27 LAB — CBC AND DIFF
ABSOLUTE LYMPH COUNT: 1.1 K/UL (ref 1.0–4.8)
ABSOLUTE NEUTROPHIL: 4.3 K/UL (ref 1.8–7.0)
BASOPHILS %: 1 % (ref 0–2)
EOSINOPHILS %: 2 % (ref 0–5)
LYMPHOCYTES %: 18 % — ABNORMAL LOW (ref 24–44)
MONOCYTES %: 10 % (ref >60)
WBC COUNT: 6.3 K/UL (ref 4.5–11.0)

## 2022-06-27 LAB — BASIC METABOLIC PANEL
ANION GAP: 11 pg (ref 3–12)
BLD UREA NITROGEN: 18 mg/dL — ABNORMAL HIGH (ref 7–25)
CALCIUM: 9.2 mg/dL (ref 8.5–10.6)
CHLORIDE: 105 MMOL/L — ABNORMAL LOW (ref 98–110)
CO2: 21 MMOL/L (ref 21–30)
CREATININE: 1.1 mg/dL — ABNORMAL HIGH (ref 0.4–1.00)
EGFR: 54 mL/min — ABNORMAL LOW (ref >60)
GLUCOSE,PANEL: 164 mg/dL — ABNORMAL HIGH (ref 70–100)
POTASSIUM: 4.2 MMOL/L — ABNORMAL LOW (ref 3.5–5.1)
SODIUM: 137 MMOL/L — ABNORMAL LOW (ref 137–147)

## 2022-06-27 NOTE — Pre-Anesthesia Patient Instructions
PREPROCEDURE INFORMATION    Arrival at the hospital  [facility]  [address]  [place], [place] [address]    Park in the P5 parking garage located at [address] [place], [place] [address].   If parking in the P5 garage, take the east elevators in the parking garage to the second level and walk to the entrance of the [employer].    Enter through the 1st floor main entrance and check in with Information Desk.   If you are a woman between the ages of 52 and 59, and have not had a hysterectomy, you will be asked for a urine sample prior to surgery.  Please do not urinate before arriving in the Surgery Waiting Room.  Once there, check in and let the attendant know if you need to provide a sample.    You will receive a call with your surgery arrival time between 2:30pm and 4:30pm the last business day before your procedure.  If you do not receive a call, please call [phone] before 4:30pm or [phone] after 4:30pm.    Eating or drinking before surgery  Nothing to eat after 11:00pm the night before your surgery including gum, mints and candy.  You may have clear liquids up to 2 hours before your surgery time.  Clear Liquid Examples include:  Water, Clear juice (apple or cranberry (no pulp or orange juice) - If diabetic blood sugar must be <200, Coffee and tea with or without sugar (no cream), Sports drink - Powerade/Gatorade, Soda, and Bowel Prep solutions only if ordered by surgeon.    Planning transportation for outpatient procedure  For your safety, you will need to arrange for a responsible ride/person to accompany you home due to sedation or anesthesia with your procedure.  An [employer]oad, taxi or other public transportation driver is not considered a responsible person to accompany you home.    Bath/Shower Instructions  Take a bath or shower with antibacterial soap the night before and the morning of your procedure. Use clean towels.  Put on clean clothes after bath or shower.  Avoid using lotion and oils.  If you are having surgery above the waist, wear a shirt that fastens up the front.  Sleep on clean sheets if bath or shower is done the night before procedure.    Morning of your procedure:  Brush your teeth and tongue  Do not smoke, vape, chew or user any tobacco products.  Do not shave the area where you will have surgery.  Remove nail polish, makeup and all jewelry (including piercings) before coming to the hospital.  Dress in clean, loose, comfortable clothing.    Valuables  Leave money, credit cards, jewelry, and any other valuables at home. If medications are being filled and picked up at a [facility] pharmacy, payment will be needed. The [facility] is not responsible for the loss or breakage of personal items.    What to bring to the hospital  ID/Insurance card  Medical Device card  Official documents for legal guardianship  Copy of your Living Will, Advanced Directives, and/or Durable Power of Attorney. If you have these documents, please bring them to the admissions office on the day of your surgery to be scanned into your records.  Do not bring medications from home unless instructed by a pharmacist.  CPAP/BiPAP machine (including all supplies)  Walker, cane, or motorized scooter  Cases for glasses/hearing aids/contact lens (bring solutions for contacts)  Notify us at [employer]: [phone] on the day of your procedure if:  You need to cancel your procedure.  You are going to be late.    Notify your surgeon if:  You become ill with a cough, fever, sore throat, nausea, vomiting or flu-like symptoms.  You have any open wounds/sores that are red, painful, draining, or are new since you last saw the doctor.  You need to cancel your procedure.    Preparing to get your medications at discharge  Your surgeon may prescribe you medications to take after your procedure.  If you like the convenience of having your medications filled here at [facility], please do the following:  Go to [facility] pharmacy after your [facility] appointment to put a credit card on file.  Call [facility] pharmacy at [phone] (Monday-Friday 7am-9pm or Saturday and Sunday 9am-5pm) to put a credit card on file.  Bring a credit card or cash on the day of your procedure- please leave with a family member rather than bringing it into the preop area.    Current Visitor Policy:  Visitors must be free of fever and symptoms to be in our facilities.  No more than 2 visitors per patient are allowed.  Additional guidelines may vary, based on patient care area or patient's condition.  Patients in semiprivate rooms may have visitors, but visits should be coordinated so only two total visitors are in a room at a time due to space limitations.  Children younger than age 2 are allowed to visit inpatients.    Thank you for participating in your Preoperative Assessment visit today.    If you have any changes to your health or hospitalizations between now and your surgery, please call us at [phone].    Instructions given to patient via: printed copy

## 2022-06-27 NOTE — Progress Notes
Preoperative Medication Plan Note:    Catherine Romero was seen in the [facility] on 06/27/2022.  As part of the visit, an accurate medication list was obtained and the patient was given pre-op medication instructions for upcoming procedure scheduled on 07/20/2022 with Dr. Vonita Moss.      Catherine [facility] [place], RN w Dr. Vonita Moss (surgeon) recommends the patient continue to take Catherine perioperatively without interruption.      The plan above was communicated to the patient in [facility] visit and they verbalized understanding.    Mir Fullilove [place], PHARMD

## 2022-07-10 ENCOUNTER — Ambulatory Visit: Payer: Medicare Other | Admitting: Internal Medicine

## 2022-07-10 NOTE — Progress Notes (Deleted)
I,Ulis Kaps T Deloria Lair, CMA,acting as a Neurosurgeon for [name], MD.,have documented all relevant documentation on the behalf of [name], MD,as directed by  [name], MD while in the presence of [name], MD.  Subjective:  Patient ID: Carolyn Baker , female    DOB: 1955/12/10 , 67 y.o.   MRN: [id number]  No chief complaint on file.   HPI  Patient presents today for bp check & chol follow up. Patient reports compliance with most meds.     Hypertension This is a chronic problem. The current episode started more than 1 year ago. The problem has been gradually improving since onset. The problem is controlled. Pertinent negatives include no blurred vision, chest pain, palpitations or shortness of breath. The current treatment provides moderate improvement. Compliance problems include exercise.   Hyperlipidemia Pertinent negatives include no chest pain or shortness of breath.     Past Medical History:  Diagnosis Date   Hypertension      Family History  Problem Relation Age of Onset   Stroke Mother    Dementia Mother    Prostate cancer Father    Breast cancer Maternal Aunt      Current Outpatient Medications:    amLODipine (NORVASC) 10 MG tablet, Take 1 tablet (10 mg total) by mouth daily., Disp: 90 tablet, Rfl: 1   atorvastatin (LIPITOR) 20 MG tablet, Take 1 tablet (20 mg total) by mouth daily., Disp: 90 tablet, Rfl: 1   Cholecalciferol (VITAMIN D3) 125 MCG (5000 UT) CAPS, Take by mouth., Disp: , Rfl:    nystatin cream (MYCOSTATIN), Apply 1 Application topically 2 (two) times daily. Please add use as needed, Disp: 45 g, Rfl: 1   valsartan (DIOVAN) 160 MG tablet, Take 1 tablet (160 mg total) by mouth daily., Disp: 90 tablet, Rfl: 2   No Known Allergies   Review of Systems  Constitutional: Negative.   Eyes:  Negative for blurred vision.  Respiratory: Negative.  Negative for shortness of breath.   Cardiovascular: Negative.  Negative for chest pain and  palpitations.  Neurological: Negative.   Psychiatric/Behavioral: Negative.       There were no vitals filed for this visit. There is no height or weight on file to calculate BMI.  Wt Readings from Last 3 Encounters:  03/29/22 212 lb 3.2 oz (96.3 kg)  02/24/22 211 lb (95.7 kg)  02/07/22 215 lb (97.5 kg)    The 10-year ASCVD risk score (Arnett DK, et al., 2019) is: 6.9%   Values used to calculate the score:     Age: 49 years     Sex: Female     Is Non-Hispanic African American: Yes     Diabetic: No     Tobacco smoker: No     Systolic Blood Pressure: 130 mmHg     Is BP treated: Yes     HDL Cholesterol: 54 mg/dL     Total Cholesterol: 131 mg/dL  Objective:  Physical Exam      Assessment And Plan:  There are no diagnoses linked to this encounter.  No follow-ups on file.  Patient was given opportunity to ask questions. Patient verbalized understanding of the plan and was able to repeat key elements of the plan. All questions were answered to their satisfaction.    I, [name], MD, have reviewed all documentation for this visit. The documentation on 07/10/22 for the exam, diagnosis, procedures, and orders are all accurate and complete.   IF YOU  HAVE BEEN REFERRED TO A SPECIALIST, IT MAY TAKE 1-2 WEEKS TO SCHEDULE/PROCESS THE REFERRAL. IF YOU HAVE NOT HEARD FROM US/SPECIALIST IN TWO WEEKS, PLEASE GIVE [place] A CALL AT [phone] X [phone].

## 2022-07-11 ENCOUNTER — Encounter: Payer: Self-pay | Admitting: Internal Medicine

## 2022-07-11 ENCOUNTER — Other Ambulatory Visit: Payer: Self-pay

## 2022-07-11 ENCOUNTER — Telehealth (INDEPENDENT_AMBULATORY_CARE_PROVIDER_SITE_OTHER): Payer: Medicare Other | Admitting: Internal Medicine

## 2022-07-11 VITALS — BP 120/76 | HR 82 | Ht 63.0 in | Wt 212.0 lb

## 2022-07-11 DIAGNOSIS — I10 Essential (primary) hypertension: Secondary | ICD-10-CM

## 2022-07-11 DIAGNOSIS — U071 COVID-19: Secondary | ICD-10-CM | POA: Diagnosis not present

## 2022-07-11 DIAGNOSIS — R7309 Other abnormal glucose: Secondary | ICD-10-CM | POA: Diagnosis not present

## 2022-07-11 MED ORDER — CETIRIZINE HCL 10 MG PO TABS
10.0000 mg | ORAL_TABLET | Freq: Every day | ORAL | 2 refills | Status: AC
Start: 2022-07-11 — End: 2023-09-27

## 2022-07-11 MED ORDER — NIRMATRELVIR/RITONAVIR (PAXLOVID)TABLET: 3.0000 | ORAL_TABLET | Freq: Two times a day (BID) | ORAL | 0 refills | Status: AC

## 2022-07-11 MED ORDER — NIRMATRELVIR/RITONAVIR (PAXLOVID)TABLET: 3.0000 | ORAL_TABLET | Freq: Two times a day (BID) | ORAL | 0 refills | Status: DC

## 2022-07-11 NOTE — Progress Notes (Signed)
I,Jameka J [place]leton, CMA,acting as a Neurosurgeon for [name], MD.,have documented all relevant documentation on the behalf of [name], MD,as directed by  [name], MD while in the presence of [name], MD.  Subjective:  Patient ID: Carolyn Baker , female    DOB: [birth year] , 67 y.o.   MRN: [id number]  Chief Complaint  Patient presents with   covid 19    Patient reports she started feeling bad on Sunday. She tested positive yesterday. She reports having bodyaches. Chill and sore throat. She has tried a few home remedies.     HPI  She presents today for further evaluation of URI symptoms.  She reports she started feeling bad on Sunday. She tested positive yesterday for COVID yesterday. She is now experiencing bodyaches, chills and sore throat. She has tried a few home remedies without improvement of her sx.   She is also due for BP check. She reports compliance with meds. States she has done well with lifestyle changes as well.   Hypertension This is a chronic problem. The current episode started more than 1 year ago. The problem has been gradually improving since onset. The problem is controlled. Pertinent negatives include no blurred vision, chest pain, palpitations or shortness of breath. The current treatment provides moderate improvement. Compliance problems include exercise.      Past Medical History:  Diagnosis Date   Hypertension      Family History  Problem Relation Age of Onset   Stroke Mother    Dementia Mother    Prostate cancer Father    Breast cancer Maternal Aunt      Current Outpatient Medications:    amLODipine (NORVASC) 10 MG tablet, Take 1 tablet (10 mg total) by mouth daily., Disp: 90 tablet, Rfl: 1   atorvastatin (LIPITOR) 20 MG tablet, Take 1 tablet (20 mg total) by mouth daily., Disp: 90 tablet, Rfl: 1   cetirizine (ZYRTEC ALLERGY) 10 MG tablet, Take 1 tablet (10 mg total) by mouth daily., Disp: 30 tablet, Rfl: 2    Cholecalciferol (VITAMIN D3) 125 MCG (5000 UT) CAPS, Take by mouth., Disp: , Rfl:    nystatin cream (MYCOSTATIN), Apply 1 Application topically 2 (two) times daily. Please add use as needed, Disp: 45 g, Rfl: 1   valsartan (DIOVAN) 160 MG tablet, Take 1 tablet (160 mg total) by mouth daily., Disp: 90 tablet, Rfl: 2   No Known Allergies   Review of Systems  Constitutional:  Positive for fatigue. Negative for chills.  HENT:  Positive for congestion and sore throat.   Eyes:  Negative for blurred vision.  Respiratory:  Positive for cough. Negative for shortness of breath.   Cardiovascular:  Negative for chest pain and palpitations.  Musculoskeletal: Negative.   Skin: Negative.   Psychiatric/Behavioral: Negative.       Today's Vitals   07/11/22 0930 07/11/22 1229  BP: (!) 141/92 120/76  Pulse: 85 82  Weight: 212 lb (96.2 kg)   Height: 5\' 3"  (1.6 m)    Body mass index is 37.55 kg/m.  Wt Readings from Last 3 Encounters:  07/11/22 212 lb (96.2 kg)  03/29/22 212 lb 3.2 oz (96.3 kg)  02/24/22 211 lb (95.7 kg)    The 10-year ASCVD risk score (Arnett DK, et al., 2019) is: 5.7%   Values used to calculate the score:     Age: 42 years     Sex: Female     Is Non-Hispanic African American: Yes  Diabetic: No     Tobacco smoker: No     Systolic Blood Pressure: 120 mmHg     Is BP treated: Yes     HDL Cholesterol: 54 mg/dL     Total Cholesterol: 131 mg/dL  Objective:  Physical Exam Vitals and nursing note reviewed.  Constitutional:      Appearance: Normal appearance. She is ill-appearing.  HENT:     Head: Normocephalic and atraumatic.     Right Ear: Tympanic membrane, ear canal and external ear normal. There is no impacted cerumen.     Left Ear: Tympanic membrane, ear canal and external ear normal. There is no impacted cerumen.     Mouth/Throat:     Pharynx: Oropharyngeal exudate present. No posterior oropharyngeal erythema.  Cardiovascular:     Rate and Rhythm: Normal rate and  regular rhythm.     Heart sounds: Normal heart sounds.  Pulmonary:     Effort: Pulmonary effort is normal.     Breath sounds: Normal breath sounds.  Musculoskeletal:     Cervical back: Normal range of motion.  Skin:    General: Skin is warm.  Neurological:     General: No focal deficit present.     Mental Status: She is alert.  Psychiatric:        Mood and Affect: Mood normal.        Behavior: Behavior normal.         Assessment And Plan:  COVID-19 Assessment & Plan:  She would like treatment, rx paxlovid sent to her pharmacy. Advised to take full course, possible side effects d/w patient. I will also refer her for home monitoring/temperature monitoring program. She is encouraged to email me daily on Mychart to let me know how she is doing. She is encouraged to go to ER should she develop worsening SOB. Pt advised that she will be out of work for five days, although she may return sooner if afebrile greater than 24 hours, she should mask for 10 days. She is also advised to stay well hydrated, move periodically throughout the day and to have a hot beverage daily.  She verbalizes understanding of her treatment plan. All questions were answered to her satisfaction. She understands that she needs to continue to self quarantine    Orders: -     Cetirizine HCl; Take 1 tablet (10 mg total) by mouth daily.  Dispense: 30 tablet; Refill: 2  Essential hypertension, benign Assessment & Plan: Chronic, well controlled. She will c/w amlodipine 10mg  and valsartan 160mg  daily. I will check renal function today.   Orders: -     BMP8+EGFR  Other abnormal glucose Assessment & Plan: Previous labs reviewed, her A1c has been elevated in the past. I will check an A1c today. Reminded to avoid refined sugars including sugary drinks/foods and processed meats including bacon, sausages and deli meats.    Orders: -     Hemoglobin A1c    No follow-ups on file.  Patient was given opportunity to ask  questions. Patient verbalized understanding of the plan and was able to repeat key elements of the plan. All questions were answered to their satisfaction.    I, [name], MD, have reviewed all documentation for this visit. The documentation on 07/27/22 for the exam, diagnosis, procedures, and orders are all accurate and complete.   IF YOU HAVE BEEN REFERRED TO A SPECIALIST, IT MAY TAKE 1-2 WEEKS TO SCHEDULE/PROCESS THE REFERRAL. IF YOU HAVE NOT HEARD FROM US/SPECIALIST IN TWO WEEKS,  PLEASE GIVE [place] A CALL AT [phone] X [phone].

## 2022-07-11 NOTE — Patient Instructions (Signed)

## 2022-07-12 ENCOUNTER — Encounter: Admit: 2022-07-12 | Discharge: 2022-07-12 | Payer: MEDICARE

## 2022-07-12 LAB — BMP8+EGFR
BUN/Creatinine Ratio: 11 — ABNORMAL LOW (ref 12–28)
BUN: 11 mg/dL (ref 8–27)
CO2: 21 mmol/L (ref 20–29)
Calcium: 9.3 mg/dL (ref 8.7–10.3)
Chloride: 103 mmol/L (ref 96–106)
Creatinine, Ser: 1.02 mg/dL — ABNORMAL HIGH (ref 0.57–1.00)
Glucose: 95 mg/dL (ref 70–99)
Potassium: 3.9 mmol/L (ref 3.5–5.2)
Sodium: 140 mmol/L (ref 134–144)
eGFR: 61 mL/min/{1.73_m2} (ref >59)

## 2022-07-12 LAB — HEMOGLOBIN A1C
Est. average glucose Bld gHb Est-mCnc: 123 mg/dL
Hgb A1c MFr Bld: 5.9 % — ABNORMAL HIGH (ref 4.8–5.6)

## 2022-07-12 NOTE — Telephone Encounter
Received VM from [facility] with questions from family for upcoming procedure.     Called back and spoke with Efraim Kaufmann #[phone]    Questions included:    1) What time is her surgery schedule? This RN discussed that scheduled time can be changed up to the day of the surgery, and the definitive time will be called and assigned the day before the surgery. However, at this point she's scheduled for 11:30am.     2) How long is the anticipated stay? Discussed with clinic staff, estimated 1-3 day hospital stay depending on how patient is doing. Family is planning to rent a motel and needed the time information.    This RN will call back if anything changes or Dr. Vonita Moss anticipates a different length of stay.

## 2022-07-20 ENCOUNTER — Ambulatory Visit: Admit: 2022-07-20 | Discharge: 2022-07-20 | Payer: MEDICARE

## 2022-07-20 ENCOUNTER — Inpatient Hospital Stay: Admit: 2022-07-20 | Discharge: 2022-07-20 | Payer: MEDICARE

## 2022-07-20 ENCOUNTER — Encounter: Admit: 2022-07-20 | Discharge: 2022-07-20 | Payer: MEDICARE

## 2022-07-20 DIAGNOSIS — K219 Gastro-esophageal reflux disease without esophagitis: Secondary | ICD-10-CM

## 2022-07-20 DIAGNOSIS — I679 Cerebrovascular disease, unspecified: Secondary | ICD-10-CM

## 2022-07-20 DIAGNOSIS — J449 Chronic obstructive pulmonary disease, unspecified: Secondary | ICD-10-CM

## 2022-07-20 DIAGNOSIS — F0393 Unspecified dementia, unspecified severity, with mood disturbance (HCC): Secondary | ICD-10-CM

## 2022-07-20 DIAGNOSIS — I10 Essential (primary) hypertension: Secondary | ICD-10-CM

## 2022-07-20 DIAGNOSIS — T502X5A Adverse effect of carbonic-anhydrase inhibitors, benzothiadiazides and other diuretics, initial encounter: Secondary | ICD-10-CM

## 2022-07-20 DIAGNOSIS — E039 Hypothyroidism, unspecified: Secondary | ICD-10-CM

## 2022-07-20 DIAGNOSIS — E119 Type 2 diabetes mellitus without complications: Secondary | ICD-10-CM

## 2022-07-20 DIAGNOSIS — F419 Anxiety disorder, unspecified: Secondary | ICD-10-CM

## 2022-07-20 DIAGNOSIS — G44221 Chronic tension-type headache, intractable: Secondary | ICD-10-CM

## 2022-07-20 DIAGNOSIS — J410 Simple chronic bronchitis: Secondary | ICD-10-CM

## 2022-07-20 DIAGNOSIS — Z96651 Presence of right artificial knee joint: Secondary | ICD-10-CM

## 2022-07-20 DIAGNOSIS — D696 Thrombocytopenia, unspecified: Secondary | ICD-10-CM

## 2022-07-20 DIAGNOSIS — R600 Localized edema: Secondary | ICD-10-CM

## 2022-07-20 DIAGNOSIS — G912 (Idiopathic) normal pressure hydrocephalus: Secondary | ICD-10-CM

## 2022-07-20 DIAGNOSIS — J3089 Other allergic rhinitis: Secondary | ICD-10-CM

## 2022-07-20 DIAGNOSIS — R519 Headache, unspecified: Secondary | ICD-10-CM

## 2022-07-20 DIAGNOSIS — G40909 Epilepsy, unspecified, not intractable, without status epilepticus: Secondary | ICD-10-CM

## 2022-07-20 DIAGNOSIS — E876 Hypokalemia: Secondary | ICD-10-CM

## 2022-07-20 MED ORDER — LIDOCAINE (PF) 200 MG/10 ML (2 %) IJ SYRG
INTRAVENOUS | 0 refills | Status: DC
Start: 2022-07-20 — End: 2022-07-20

## 2022-07-20 MED ORDER — ROCURONIUM 10 MG/ML IV SOLN
INTRAVENOUS | 0 refills | Status: DC
Start: 2022-07-20 — End: 2022-07-20

## 2022-07-20 MED ORDER — PHENYLEPHRINE HCL IN 0.9% NACL 1 MG/10 ML (100 MCG/ML) IV SYRG
INTRAVENOUS | 0 refills | Status: DC
Start: 2022-07-20 — End: 2022-07-20

## 2022-07-20 MED ORDER — ONDANSETRON HCL (PF) 4 MG/2 ML IJ SOLN
INTRAVENOUS | 0 refills | Status: DC
Start: 2022-07-20 — End: 2022-07-20

## 2022-07-20 MED ORDER — SUGAMMADEX 100 MG/ML IV SOLN
INTRAVENOUS | 0 refills | Status: DC
Start: 2022-07-20 — End: 2022-07-20

## 2022-07-20 MED ORDER — EPHEDRINE SULFATE 50 MG/ML IV SOLN
INTRAVENOUS | 0 refills | Status: DC
Start: 2022-07-20 — End: 2022-07-20

## 2022-07-20 MED ORDER — DEXAMETHASONE SODIUM PHOSPHATE 4 MG/ML IJ SOLN
INTRAVENOUS | 0 refills | Status: DC
Start: 2022-07-20 — End: 2022-07-20

## 2022-07-20 MED ORDER — CEFAZOLIN 1 GRAM IJ SOLR
INTRAVENOUS | 0 refills | Status: DC
Start: 2022-07-20 — End: 2022-07-20

## 2022-07-20 MED ORDER — FENTANYL CITRATE (PF) 50 MCG/ML IJ SOLN
INTRAVENOUS | 0 refills | Status: DC
Start: 2022-07-20 — End: 2022-07-20

## 2022-07-20 MED ORDER — ARTIFICIAL TEARS (PF) SINGLE DOSE DROPS GROUP
OPHTHALMIC | 0 refills | Status: DC
Start: 2022-07-20 — End: 2022-07-20

## 2022-07-20 MED ORDER — PROPOFOL INJ 10 MG/ML IV VIAL
INTRAVENOUS | 0 refills | Status: DC
Start: 2022-07-20 — End: 2022-07-20

## 2022-07-20 MED ORDER — ACETAMINOPHEN 1,000 MG/100 ML (10 MG/ML) IV SOLN
INTRAVENOUS | 0 refills | Status: DC
Start: 2022-07-20 — End: 2022-07-20

## 2022-07-20 MED ADMIN — FENTANYL CITRATE (PF) 50 MCG/ML IJ SOLN [3037]: 25 ug | INTRAVENOUS | @ 21:00:00 | Stop: 2022-07-20 | NDC 00409909412

## 2022-07-20 MED ADMIN — CEFAZOLIN 1 GRAM IJ SOLR [1445]: 1000 mL | @ 18:00:00 | Stop: 2022-07-20 | NDC 00143992490

## 2022-07-20 MED ADMIN — METHOCARBAMOL 500 MG PO TAB [4971]: 500 mg | ORAL | @ 23:00:00 | NDC 00904705761

## 2022-07-20 MED ADMIN — OXYCODONE 5 MG PO TAB [10814]: 5 mg | ORAL | @ 23:00:00 | NDC 00904696661

## 2022-07-20 MED ADMIN — FENTANYL CITRATE (PF) 50 MCG/ML IJ SOLN [3037]: 50 ug | INTRAVENOUS | @ 20:00:00 | Stop: 2022-07-20 | NDC 00409909412

## 2022-07-20 MED ADMIN — SODIUM CHLORIDE 0.9 % IV SOLP [27838]: 1000 mL | INTRAVENOUS | @ 16:00:00 | Stop: 2022-07-20 | NDC 00338004904

## 2022-07-20 MED ADMIN — INSULIN ASPART 100 UNIT/ML SC FLEXPEN [87504]: 1 [IU] | SUBCUTANEOUS | @ 23:00:00 | NDC 00169633910

## 2022-07-20 MED ADMIN — SODIUM CHLORIDE 0.9 % IR SOLN [11403]: 1000 mL | @ 18:00:00 | Stop: 2022-07-20 | NDC 00338004804

## 2022-07-20 MED ADMIN — ACETAMINOPHEN 500 MG PO TAB [102]: 1000 mg | ORAL | Stop: 2022-07-22 | NDC 00904673061

## 2022-07-20 MED ADMIN — VENLAFAXINE 75 MG PO TAB [77452]: 75 mg | ORAL | @ 23:00:00 | NDC 68084085611

## 2022-07-20 MED ADMIN — FENTANYL CITRATE (PF) 50 MCG/ML IJ SOLN [3037]: 25 ug | INTRAVENOUS | @ 20:00:00 | Stop: 2022-07-20 | NDC 00409909412

## 2022-07-21 MED ADMIN — OXYCODONE 5 MG PO TAB [10814]: 5 mg | ORAL | @ 21:00:00 | NDC 00904696661

## 2022-07-21 MED ADMIN — MONTELUKAST 10 MG PO TAB [81627]: 10 mg | ORAL | @ 02:00:00 | NDC 00904680861

## 2022-07-21 MED ADMIN — VENLAFAXINE 75 MG PO TAB [77452]: 75 mg | ORAL | @ 17:00:00 | NDC 68084085611

## 2022-07-21 MED ADMIN — ARIPIPRAZOLE 5 MG PO TAB [88401]: 2.5 mg | ORAL | @ 14:00:00 | NDC 00904651061

## 2022-07-21 MED ADMIN — ACETAMINOPHEN 500 MG PO TAB [102]: 1000 mg | ORAL | @ 11:00:00 | Stop: 2022-07-22 | NDC 00904673061

## 2022-07-21 MED ADMIN — SENNOSIDES-DOCUSATE SODIUM 8.6-50 MG PO TAB [40926]: 1 | ORAL | @ 14:00:00 | NDC 00536124810

## 2022-07-21 MED ADMIN — TRAZODONE 150 MG PO TAB [8084]: 150 mg | ORAL | @ 02:00:00 | NDC 00904721261

## 2022-07-21 MED ADMIN — TOPIRAMATE 25 MG PO TAB [78823]: 100 mg | ORAL | @ 14:00:00 | NDC 68084034211

## 2022-07-21 MED ADMIN — MELATONIN 5 MG PO TAB [168576]: 5 mg | ORAL | @ 02:00:00 | NDC 77333052025

## 2022-07-21 MED ADMIN — FENTANYL CITRATE (PF) 50 MCG/ML IJ SOLN [3037]: 25 ug | INTRAVENOUS | @ 02:00:00 | Stop: 2022-07-21 | NDC 00409909412

## 2022-07-21 MED ADMIN — FUROSEMIDE 20 MG PO TAB [3294]: 40 mg | ORAL | @ 02:00:00 | NDC 00904717761

## 2022-07-21 MED ADMIN — OXYCODONE 5 MG PO TAB [10814]: 5 mg | ORAL | @ 11:00:00 | NDC 00904696661

## 2022-07-21 MED ADMIN — FUROSEMIDE 20 MG PO TAB [3294]: 40 mg | ORAL | @ 14:00:00 | NDC 00904717761

## 2022-07-21 MED ADMIN — QUETIAPINE 25 MG PO TAB [76945]: 75 mg | ORAL | @ 02:00:00 | NDC 00904663861

## 2022-07-21 MED ADMIN — PANTOPRAZOLE 20 MG PO TBEC [79463]: 20 mg | ORAL | @ 02:00:00 | NDC 50268058511

## 2022-07-21 MED ADMIN — OXYCODONE 5 MG PO TAB [10814]: 5 mg | ORAL | @ 17:00:00 | NDC 00904696661

## 2022-07-21 MED ADMIN — FUROSEMIDE 20 MG PO TAB [3294]: 20 mg | ORAL | @ 21:00:00 | NDC 00904717761

## 2022-07-21 MED ADMIN — TOPIRAMATE 100 MG PO TAB [82418]: 300 mg | ORAL | @ 02:00:00 | NDC 68382014014

## 2022-07-21 MED ADMIN — OXYCODONE 5 MG PO TAB [10814]: 5 mg | ORAL | @ 05:00:00 | NDC 00904696661

## 2022-07-21 MED ADMIN — VENLAFAXINE 75 MG PO TAB [77452]: 75 mg | ORAL | @ 14:00:00 | NDC 68084085611

## 2022-07-21 MED ADMIN — DOCUSATE SODIUM 100 MG PO CAP [2566]: 100 mg | ORAL | @ 14:00:00 | NDC 00904718361

## 2022-07-21 MED ADMIN — VENLAFAXINE 75 MG PO TAB [77452]: 75 mg | ORAL | @ 23:00:00 | NDC 68084085611

## 2022-07-21 MED ADMIN — CEFAZOLIN 2 GRAM IV SOLR [462609]: 2 g | INTRAVENOUS | @ 10:00:00 | Stop: 2022-07-21 | NDC 00143913901

## 2022-07-21 MED ADMIN — QUETIAPINE 25 MG PO TAB [76945]: 75 mg | ORAL | @ 14:00:00 | NDC 00904663861

## 2022-07-21 MED ADMIN — ACETAMINOPHEN 500 MG PO TAB [102]: 1000 mg | ORAL | @ 19:00:00 | Stop: 2022-07-22 | NDC 00904673061

## 2022-07-21 MED ADMIN — METHOCARBAMOL 500 MG PO TAB [4971]: 500 mg | ORAL | @ 12:00:00 | NDC 00904705761

## 2022-07-21 MED ADMIN — FLUTICASONE PROPIONATE 50 MCG/ACTUATION NA SPSN [70536]: 2 | NASAL | @ 23:00:00 | NDC 60505082901

## 2022-07-21 MED ADMIN — LEVOTHYROXINE 75 MCG PO TAB [4422]: 37.5 ug | ORAL | @ 11:00:00 | NDC 00904695161

## 2022-07-21 MED ADMIN — MAGNESIUM OXIDE 400 MG (241.3 MG MAGNESIUM) PO TAB [10491]: 400 mg | ORAL | @ 14:00:00 | NDC 10006070028

## 2022-07-21 MED ADMIN — METFORMIN 500 MG PO TAB [10544]: 1000 mg | ORAL | @ 14:00:00 | NDC 70010006301

## 2022-07-21 MED ADMIN — CETIRIZINE 10 MG PO TAB [77730]: 10 mg | ORAL | @ 14:00:00 | NDC 00904671761

## 2022-07-21 MED ADMIN — METHOCARBAMOL 500 MG PO TAB [4971]: 500 mg | ORAL | @ 21:00:00 | NDC 00904705761

## 2022-07-21 MED ADMIN — POTASSIUM CHLORIDE 20 MEQ PO TBTQ [35943]: 20 meq | ORAL | @ 14:00:00 | NDC 00832532510

## 2022-07-21 MED ADMIN — CEFAZOLIN 2 GRAM IV SOLR [462609]: 2 g | INTRAVENOUS | @ 02:00:00 | Stop: 2022-07-21 | NDC 00143913901

## 2022-07-21 MED ADMIN — TRAZODONE 150 MG PO TAB [8084]: 100 mg | ORAL | @ 02:00:00 | NDC 00904721261

## 2022-07-21 MED ADMIN — MAGNESIUM OXIDE 400 MG (241.3 MG MAGNESIUM) PO TAB [10491]: 400 mg | ORAL | @ 02:00:00 | NDC 10006070028

## 2022-07-22 ENCOUNTER — Encounter: Admit: 2022-07-22 | Discharge: 2022-07-22 | Payer: MEDICARE

## 2022-07-22 DIAGNOSIS — T502X5A Adverse effect of carbonic-anhydrase inhibitors, benzothiadiazides and other diuretics, initial encounter: Secondary | ICD-10-CM

## 2022-07-22 DIAGNOSIS — K219 Gastro-esophageal reflux disease without esophagitis: Secondary | ICD-10-CM

## 2022-07-22 DIAGNOSIS — G40909 Epilepsy, unspecified, not intractable, without status epilepticus: Secondary | ICD-10-CM

## 2022-07-22 DIAGNOSIS — I10 Essential (primary) hypertension: Secondary | ICD-10-CM

## 2022-07-22 DIAGNOSIS — I679 Cerebrovascular disease, unspecified: Secondary | ICD-10-CM

## 2022-07-22 DIAGNOSIS — Z96651 Presence of right artificial knee joint: Secondary | ICD-10-CM

## 2022-07-22 DIAGNOSIS — J449 Chronic obstructive pulmonary disease, unspecified: Secondary | ICD-10-CM

## 2022-07-22 DIAGNOSIS — E119 Type 2 diabetes mellitus without complications: Secondary | ICD-10-CM

## 2022-07-22 DIAGNOSIS — J410 Simple chronic bronchitis: Secondary | ICD-10-CM

## 2022-07-22 DIAGNOSIS — G44221 Chronic tension-type headache, intractable: Secondary | ICD-10-CM

## 2022-07-22 DIAGNOSIS — F419 Anxiety disorder, unspecified: Secondary | ICD-10-CM

## 2022-07-22 DIAGNOSIS — F0393 Unspecified dementia, unspecified severity, with mood disturbance (HCC): Secondary | ICD-10-CM

## 2022-07-22 DIAGNOSIS — G912 (Idiopathic) normal pressure hydrocephalus: Secondary | ICD-10-CM

## 2022-07-22 DIAGNOSIS — E876 Hypokalemia: Secondary | ICD-10-CM

## 2022-07-22 DIAGNOSIS — D696 Thrombocytopenia, unspecified: Secondary | ICD-10-CM

## 2022-07-22 DIAGNOSIS — J3089 Other allergic rhinitis: Secondary | ICD-10-CM

## 2022-07-22 DIAGNOSIS — E039 Hypothyroidism, unspecified: Secondary | ICD-10-CM

## 2022-07-22 DIAGNOSIS — R519 Headache, unspecified: Secondary | ICD-10-CM

## 2022-07-22 DIAGNOSIS — R600 Localized edema: Secondary | ICD-10-CM

## 2022-07-22 MED ADMIN — ACETAMINOPHEN 325 MG PO TAB [101]: 650 mg | ORAL | @ 17:00:00 | NDC 00904677361

## 2022-07-22 MED ADMIN — LEVOTHYROXINE 75 MCG PO TAB [4422]: 37.5 ug | ORAL | @ 11:00:00 | NDC 00904695161

## 2022-07-22 MED ADMIN — ACETAMINOPHEN 325 MG PO TAB [101]: 650 mg | ORAL | @ 10:00:00 | NDC 00904677361

## 2022-07-22 MED ADMIN — OXYCODONE 5 MG PO TAB [10814]: 5 mg | ORAL | @ 23:00:00 | NDC 00904696661

## 2022-07-22 MED ADMIN — ACETAMINOPHEN 325 MG PO TAB [101]: 650 mg | ORAL | @ 23:00:00 | NDC 00904677361

## 2022-07-22 MED ADMIN — TRAZODONE 100 MG PO TAB [8083]: 100 mg | ORAL | @ 02:00:00 | NDC 00904686961

## 2022-07-22 MED ADMIN — QUETIAPINE 25 MG PO TAB [76945]: 75 mg | ORAL | @ 02:00:00 | NDC 00904663861

## 2022-07-22 MED ADMIN — PANTOPRAZOLE 20 MG PO TBEC [79463]: 20 mg | ORAL | @ 02:00:00 | NDC 50268058511

## 2022-07-22 MED ADMIN — MAGNESIUM OXIDE 400 MG (241.3 MG MAGNESIUM) PO TAB [10491]: 400 mg | ORAL | @ 13:00:00 | NDC 10006070028

## 2022-07-22 MED ADMIN — METHOCARBAMOL 500 MG PO TAB [4971]: 500 mg | ORAL | @ 13:00:00 | NDC 00904705761

## 2022-07-22 MED ADMIN — CETIRIZINE 10 MG PO TAB [77730]: 10 mg | ORAL | @ 13:00:00 | NDC 00904671761

## 2022-07-22 MED ADMIN — VENLAFAXINE 75 MG PO TAB [77452]: 75 mg | ORAL | @ 13:00:00 | NDC 68084085611

## 2022-07-22 MED ADMIN — FUROSEMIDE 20 MG PO TAB [3294]: 40 mg | ORAL | @ 02:00:00 | NDC 00904717761

## 2022-07-22 MED ADMIN — QUETIAPINE 25 MG PO TAB [76945]: 75 mg | ORAL | @ 13:00:00 | NDC 00904663861

## 2022-07-22 MED ADMIN — METFORMIN 500 MG PO TAB [10544]: 1000 mg | ORAL | @ 13:00:00 | NDC 70010006301

## 2022-07-22 MED ADMIN — OXYCODONE 5 MG PO TAB [10814]: 5 mg | ORAL | @ 02:00:00 | NDC 00904696661

## 2022-07-22 MED ADMIN — TOPIRAMATE 100 MG PO TAB [82418]: 300 mg | ORAL | @ 02:00:00 | NDC 68382014014

## 2022-07-22 MED ADMIN — SENNOSIDES-DOCUSATE SODIUM 8.6-50 MG PO TAB [40926]: 1 | ORAL | @ 13:00:00 | NDC 00536124810

## 2022-07-22 MED ADMIN — DOCUSATE SODIUM 100 MG PO CAP [2566]: 100 mg | ORAL | @ 13:00:00 | NDC 00904718361

## 2022-07-22 MED ADMIN — VENLAFAXINE 75 MG PO TAB [77452]: 75 mg | ORAL | @ 23:00:00 | NDC 68084085611

## 2022-07-22 MED ADMIN — ACETAMINOPHEN 325 MG PO TAB [101]: 650 mg | ORAL | @ 02:00:00 | NDC 00904677361

## 2022-07-22 MED ADMIN — OXYCODONE 5 MG PO TAB [10814]: 5 mg | ORAL | @ 10:00:00 | NDC 00904696661

## 2022-07-22 MED ADMIN — OXYCODONE 5 MG PO TAB [10814]: 5 mg | ORAL | @ 17:00:00 | NDC 00904696661

## 2022-07-22 MED ADMIN — FUROSEMIDE 20 MG PO TAB [3294]: 20 mg | ORAL | @ 21:00:00 | NDC 00904717761

## 2022-07-22 MED ADMIN — ARIPIPRAZOLE 5 MG PO TAB [88401]: 2.5 mg | ORAL | @ 13:00:00 | NDC 00904651061

## 2022-07-22 MED ADMIN — TOPIRAMATE 25 MG PO TAB [78823]: 100 mg | ORAL | @ 13:00:00 | NDC 68084034211

## 2022-07-22 MED ADMIN — POTASSIUM CHLORIDE 20 MEQ PO TBTQ [35943]: 20 meq | ORAL | @ 13:00:00 | NDC 00832532510

## 2022-07-22 MED ADMIN — TRAZODONE 150 MG PO TAB [8084]: 150 mg | ORAL | @ 02:00:00 | NDC 00904721261

## 2022-07-22 MED ADMIN — FUROSEMIDE 20 MG PO TAB [3294]: 40 mg | ORAL | @ 13:00:00 | NDC 00904717761

## 2022-07-22 MED ADMIN — MAGNESIUM OXIDE 400 MG (241.3 MG MAGNESIUM) PO TAB [10491]: 400 mg | ORAL | @ 02:00:00 | NDC 10006070028

## 2022-07-22 MED ADMIN — MONTELUKAST 10 MG PO TAB [81627]: 10 mg | ORAL | @ 02:00:00 | NDC 00904680861

## 2022-07-22 MED ADMIN — MAGNESIUM HYDROXIDE 400 MG/5 ML PO SUSP [79944]: 30 mL | ORAL | @ 13:00:00 | NDC 00121043130

## 2022-07-22 MED ADMIN — VENLAFAXINE 75 MG PO TAB [77452]: 75 mg | ORAL | @ 17:00:00 | NDC 68084085611

## 2022-07-23 MED ADMIN — QUETIAPINE 25 MG PO TAB [76945]: 75 mg | ORAL | @ 14:00:00 | Stop: 2022-07-23 | NDC 00904663861

## 2022-07-23 MED ADMIN — OXYCODONE 5 MG PO TAB [10814]: 5 mg | ORAL | @ 03:00:00 | NDC 00904696661

## 2022-07-23 MED ADMIN — KETOROLAC 10 MG PO TAB [10371]: 10 mg | ORAL | @ 16:00:00 | Stop: 2022-07-23 | NDC 70710171001

## 2022-07-23 MED ADMIN — TRAZODONE 150 MG PO TAB [8084]: 150 mg | ORAL | @ 02:00:00 | NDC 00904721261

## 2022-07-23 MED ADMIN — FUROSEMIDE 20 MG PO TAB [3294]: 40 mg | ORAL | @ 02:00:00 | NDC 00904717761

## 2022-07-23 MED ADMIN — OXYCODONE 5 MG PO TAB [10814]: 5 mg | ORAL | @ 12:00:00 | Stop: 2022-07-23 | NDC 00904696661

## 2022-07-23 MED ADMIN — TRAZODONE 100 MG PO TAB [8083]: 100 mg | ORAL | @ 02:00:00 | NDC 00904686961

## 2022-07-23 MED ADMIN — VENLAFAXINE 75 MG PO TAB [77452]: 75 mg | ORAL | @ 14:00:00 | Stop: 2022-07-23 | NDC 68084085611

## 2022-07-23 MED ADMIN — HEPARIN, PORCINE (PF) 5,000 UNIT/0.5 ML IJ SYRG [95535]: 5000 [IU] | SUBCUTANEOUS | @ 02:00:00 | NDC 00409131611

## 2022-07-23 MED ADMIN — METHOCARBAMOL 500 MG PO TAB [4971]: 500 mg | ORAL | @ 02:00:00 | NDC 00904705761

## 2022-07-23 MED ADMIN — MAGNESIUM HYDROXIDE 400 MG/5 ML PO SUSP [79944]: 30 mL | ORAL | @ 14:00:00 | Stop: 2022-07-23 | NDC 00121043130

## 2022-07-23 MED ADMIN — MAGNESIUM OXIDE 400 MG (241.3 MG MAGNESIUM) PO TAB [10491]: 400 mg | ORAL | @ 14:00:00 | Stop: 2022-07-23 | NDC 10006070028

## 2022-07-23 MED ADMIN — MAGNESIUM OXIDE 400 MG (241.3 MG MAGNESIUM) PO TAB [10491]: 400 mg | ORAL | @ 02:00:00 | NDC 10006070028

## 2022-07-23 MED ADMIN — MONTELUKAST 10 MG PO TAB [81627]: 10 mg | ORAL | @ 02:00:00 | NDC 00904680861

## 2022-07-23 MED ADMIN — DOCUSATE SODIUM 100 MG PO CAP [2566]: 100 mg | ORAL | @ 02:00:00 | NDC 00904718361

## 2022-07-23 MED ADMIN — SENNOSIDES-DOCUSATE SODIUM 8.6-50 MG PO TAB [40926]: 1 | ORAL | @ 02:00:00 | NDC 00536124810

## 2022-07-23 MED ADMIN — QUETIAPINE 25 MG PO TAB [76945]: 75 mg | ORAL | @ 02:00:00 | NDC 00904663861

## 2022-07-23 MED ADMIN — TOPIRAMATE 100 MG PO TAB [82418]: 300 mg | ORAL | @ 02:00:00 | NDC 68084034411

## 2022-07-23 MED ADMIN — OXYCODONE 5 MG PO TAB [10814]: 5 mg | ORAL | @ 08:00:00 | Stop: 2022-07-23 | NDC 00904696661

## 2022-07-23 MED ADMIN — HEPARIN, PORCINE (PF) 5,000 UNIT/0.5 ML IJ SYRG [95535]: 5000 [IU] | SUBCUTANEOUS | @ 10:00:00 | Stop: 2022-07-23 | NDC 00409131611

## 2022-07-23 MED ADMIN — FUROSEMIDE 20 MG PO TAB [3294]: 40 mg | ORAL | @ 14:00:00 | Stop: 2022-07-23 | NDC 00904717761

## 2022-07-23 MED ADMIN — ACETAMINOPHEN 325 MG PO TAB [101]: 650 mg | ORAL | @ 12:00:00 | Stop: 2022-07-23 | NDC 00904677361

## 2022-07-23 MED ADMIN — DOCUSATE SODIUM 100 MG PO CAP [2566]: 100 mg | ORAL | @ 14:00:00 | Stop: 2022-07-23 | NDC 00904718361

## 2022-07-23 MED ADMIN — TOPIRAMATE 25 MG PO TAB [78823]: 100 mg | ORAL | @ 14:00:00 | Stop: 2022-07-23 | NDC 68084034211

## 2022-07-23 MED ADMIN — CETIRIZINE 10 MG PO TAB [77730]: 10 mg | ORAL | @ 14:00:00 | Stop: 2022-07-23 | NDC 00904671761

## 2022-07-23 MED ADMIN — METHOCARBAMOL 500 MG PO TAB [4971]: 500 mg | ORAL | @ 10:00:00 | Stop: 2022-07-23 | NDC 00904705761

## 2022-07-23 MED ADMIN — ARIPIPRAZOLE 5 MG PO TAB [88401]: 2.5 mg | ORAL | @ 14:00:00 | Stop: 2022-07-23 | NDC 00904651061

## 2022-07-23 MED ADMIN — PANTOPRAZOLE 20 MG PO TBEC [79463]: 20 mg | ORAL | @ 02:00:00 | NDC 50268058511

## 2022-07-23 MED ADMIN — SENNOSIDES-DOCUSATE SODIUM 8.6-50 MG PO TAB [40926]: 1 | ORAL | @ 14:00:00 | Stop: 2022-07-23 | NDC 00536124810

## 2022-07-23 MED ADMIN — ACETAMINOPHEN 325 MG PO TAB [101]: 650 mg | ORAL | @ 08:00:00 | Stop: 2022-07-23 | NDC 00904677361

## 2022-07-23 MED ADMIN — POTASSIUM CHLORIDE 20 MEQ PO TBTQ [35943]: 20 meq | ORAL | @ 14:00:00 | Stop: 2022-07-23 | NDC 00832532510

## 2022-07-23 MED ADMIN — LEVOTHYROXINE 75 MCG PO TAB [4422]: 37.5 ug | ORAL | @ 10:00:00 | Stop: 2022-07-23 | NDC 00904695161

## 2022-07-23 MED ADMIN — METFORMIN 500 MG PO TAB [10544]: 1000 mg | ORAL | @ 14:00:00 | Stop: 2022-07-23 | NDC 70010006301

## 2022-07-27 DIAGNOSIS — U071 COVID-19: Secondary | ICD-10-CM | POA: Insufficient documentation

## 2022-07-27 DIAGNOSIS — R7309 Other abnormal glucose: Secondary | ICD-10-CM | POA: Insufficient documentation

## 2022-07-27 NOTE — Assessment & Plan Note (Signed)
Previous labs reviewed, her A1c has been elevated in the past. I will check an A1c today. Reminded to avoid refined sugars including sugary drinks/foods and processed meats including bacon, sausages and deli meats.  

## 2022-07-27 NOTE — Assessment & Plan Note (Signed)
She would like treatment, rx paxlovid sent to her pharmacy. Advised to take full course, possible side effects d/w patient. I will also refer her for home monitoring/temperature monitoring program. She is encouraged to email me daily on Mychart to let me know how she is doing. She is encouraged to go to ER should she develop worsening SOB. Pt advised that she will be out of work for five days, although she may return sooner if afebrile greater than 24 hours, she should mask for 10 days. She is also advised to stay well hydrated, move periodically throughout the day and to have a hot beverage daily.  She verbalizes understanding of her treatment plan. All questions were answered to her satisfaction. She understands that she needs to continue to self quarantine

## 2022-07-27 NOTE — Assessment & Plan Note (Signed)
Chronic, well controlled. She will c/w amlodipine 10mg  and valsartan 160mg  daily. I will check renal function today.

## 2022-07-27 NOTE — Assessment & Plan Note (Deleted)
Chronic, well controlled. She will c/w amlodipine 10mg  and valsartan 160mg  daily. I will check renal function today.

## 2022-07-31 ENCOUNTER — Encounter: Admit: 2022-07-31 | Discharge: 2022-07-31 | Payer: MEDICARE

## 2022-07-31 DIAGNOSIS — Z4889 Encounter for other specified surgical aftercare: Secondary | ICD-10-CM

## 2022-07-31 DIAGNOSIS — G919 Hydrocephalus, unspecified: Secondary | ICD-10-CM

## 2022-07-31 MED ORDER — OXYCODONE 5 MG PO TAB
5 mg | ORAL_TABLET | ORAL | 0 refills | PRN
Start: 2022-07-31 — End: ?

## 2022-07-31 NOTE — Telephone Encounter
Received call from [facility], facility where patient resides.     Requesting refill of oxycodone. States patient took last oxycodone this morning at 5am, being treated with tylenol as well, but still having breakthrough pain on left side of head.     Per chart review, prescribed #20 tabs on 07/21. Nurse reviewed chart-- patient averaging 1-3 tabs a day, with the last several days having worsening pain and requiring 3 tablets, with tylenol in between.    Follow up appointment 08/01 with Byrd Hesselbach, APRN.    Sent message to Dr. Vonita Moss asking approval for refill.     Will call back [facility] with plan # [phone]  --    Per Dr. Vonita Moss, okay to refill x 1. Requesting CT of the head at the appointment with Byrd Hesselbach, APRN.     CT Head order placed. Called CT to schedule. Scheduled for 8 am. Prescription routed to Dr. Vonita Moss. [facility] updated. They are 3 hours away and will work with transportation to make sure patient can get here earlier. Will call if there's any issues.

## 2022-08-03 ENCOUNTER — Ambulatory Visit: Admit: 2022-08-03 | Discharge: 2022-08-03 | Payer: MEDICARE

## 2022-08-03 ENCOUNTER — Encounter: Admit: 2022-08-03 | Discharge: 2022-08-03 | Payer: MEDICARE

## 2022-08-03 DIAGNOSIS — Z982 Presence of cerebrospinal fluid drainage device: Secondary | ICD-10-CM

## 2022-08-03 DIAGNOSIS — I10 Essential (primary) hypertension: Secondary | ICD-10-CM

## 2022-08-03 DIAGNOSIS — D696 Thrombocytopenia, unspecified: Secondary | ICD-10-CM

## 2022-08-03 DIAGNOSIS — K219 Gastro-esophageal reflux disease without esophagitis: Secondary | ICD-10-CM

## 2022-08-03 DIAGNOSIS — G919 Hydrocephalus, unspecified: Secondary | ICD-10-CM

## 2022-08-03 DIAGNOSIS — J410 Simple chronic bronchitis: Secondary | ICD-10-CM

## 2022-08-03 DIAGNOSIS — J449 Chronic obstructive pulmonary disease, unspecified: Secondary | ICD-10-CM

## 2022-08-03 DIAGNOSIS — F419 Anxiety disorder, unspecified: Secondary | ICD-10-CM

## 2022-08-03 DIAGNOSIS — I679 Cerebrovascular disease, unspecified: Secondary | ICD-10-CM

## 2022-08-03 DIAGNOSIS — J3089 Other allergic rhinitis: Secondary | ICD-10-CM

## 2022-08-03 DIAGNOSIS — Z96651 Presence of right artificial knee joint: Secondary | ICD-10-CM

## 2022-08-03 DIAGNOSIS — E119 Type 2 diabetes mellitus without complications: Secondary | ICD-10-CM

## 2022-08-03 DIAGNOSIS — G44221 Chronic tension-type headache, intractable: Secondary | ICD-10-CM

## 2022-08-03 DIAGNOSIS — F0393 Unspecified dementia, unspecified severity, with mood disturbance (HCC): Secondary | ICD-10-CM

## 2022-08-03 DIAGNOSIS — R600 Localized edema: Secondary | ICD-10-CM

## 2022-08-03 DIAGNOSIS — E876 Hypokalemia: Secondary | ICD-10-CM

## 2022-08-03 DIAGNOSIS — G40909 Epilepsy, unspecified, not intractable, without status epilepticus: Secondary | ICD-10-CM

## 2022-08-03 DIAGNOSIS — E039 Hypothyroidism, unspecified: Secondary | ICD-10-CM

## 2022-08-03 DIAGNOSIS — R519 Headache, unspecified: Secondary | ICD-10-CM

## 2022-08-03 DIAGNOSIS — T502X5A Adverse effect of carbonic-anhydrase inhibitors, benzothiadiazides and other diuretics, initial encounter: Secondary | ICD-10-CM

## 2022-08-03 DIAGNOSIS — G912 (Idiopathic) normal pressure hydrocephalus: Secondary | ICD-10-CM

## 2022-08-03 NOTE — Patient Instructions
It was a pleasure seeing you today in the Neurosurgery Clinic!   Our Nurse Practitioner, Byrd Hesselbach, would like you to keep your follow up appointments as scheduled.    If you have any questions or concerns, please don't hesitate to call us or send a message via MyChart.  Thank you and take care!     General Instructions:  Scheduling:  Our scheduling phone number is [phone] option #1  How to reach our office:  Please send a MyChart message to Neurosurgery or leave a voicemail for the nurse  How to get a medication refill:  Please use the MyChart Refill request or contact your pharmacy directly to request medication refills.  Please allow 72 business hours for request to be completed.    Support for many chronic illnesses is available through [employer] at [email or web address] or [phone].    For help with MyChart:  please call [phone].    For questions on nights, weekends or holidays:  call the Operator at [phone], and ask for the doctor on call for Neurosurgery.      [facility]  Phone: [phone] - Fax: [phone]

## 2022-08-08 ENCOUNTER — Ambulatory Visit: Payer: Medicare Other | Admitting: Internal Medicine

## 2022-10-09 ENCOUNTER — Ambulatory Visit: Payer: Medicare Other | Admitting: Internal Medicine

## 2022-10-10 ENCOUNTER — Encounter: Payer: Self-pay | Admitting: Internal Medicine

## 2022-10-10 ENCOUNTER — Ambulatory Visit: Payer: Medicare Other | Admitting: Internal Medicine

## 2022-10-10 ENCOUNTER — Other Ambulatory Visit: Payer: Self-pay | Admitting: Internal Medicine

## 2022-10-10 VITALS — BP 130/80 | HR 92 | Temp 98.0°F | Ht 63.0 in | Wt 215.4 lb

## 2022-10-10 DIAGNOSIS — F4321 Adjustment disorder with depressed mood: Secondary | ICD-10-CM | POA: Diagnosis not present

## 2022-10-10 DIAGNOSIS — I10 Essential (primary) hypertension: Secondary | ICD-10-CM

## 2022-10-10 DIAGNOSIS — Z6838 Body mass index (BMI) 38.0-38.9, adult: Secondary | ICD-10-CM

## 2022-10-10 DIAGNOSIS — R7309 Other abnormal glucose: Secondary | ICD-10-CM

## 2022-10-10 DIAGNOSIS — E66812 Obesity, class 2: Secondary | ICD-10-CM

## 2022-10-10 NOTE — Assessment & Plan Note (Addendum)
Chronic, fair control. She is currently grieving. No med changes today. She will continue with amlodipine 10mg  and valsartan 160mg  daily. Reminded to follow low sodium diet. She will f/u in 3 months for her physical examination.

## 2022-10-10 NOTE — Assessment & Plan Note (Signed)
I will refer her to Hospice for grief counseling. She is in agreement with treatment plan.

## 2022-10-10 NOTE — Assessment & Plan Note (Signed)
She is encouraged to strive for BMI less than 30 to decrease cardiac risk. Advised to aim for at least 150 minutes of exercise per week.  

## 2022-10-10 NOTE — Progress Notes (Signed)
I,Victoria T Deloria Lair, CMA,acting as a Neurosurgeon for [name], MD.,have documented all relevant documentation on the behalf of [name], MD,as directed by  [name], MD while in the presence of [name], MD.  Subjective:  Patient ID: Carolyn Baker , female    DOB: Apr 04, 1955 , 67 y.o.   MRN: [id number]  Chief Complaint  Patient presents with   Hypertension   Hyperlipidemia    HPI  Patient presents today for bp check follow up. Patient reports compliance with most meds.  She reports recently losing 2 family members & 2 classmates. She reports her bp readings may be up due to the stress of these losses.   She admits still actively grieving.     Hypertension This is a chronic problem. The current episode started more than 1 year ago. The problem has been gradually improving since onset. The problem is controlled. Pertinent negatives include no blurred vision or palpitations. Past treatments include angiotensin blockers and calcium channel blockers. The current treatment provides moderate improvement. Compliance problems include exercise.   0   Past Medical History:  Diagnosis Date   Hypertension      Family History  Problem Relation Age of Onset   Stroke Mother    Dementia Mother    Prostate cancer Father    Breast cancer Maternal Aunt      Current Outpatient Medications:    amLODipine (NORVASC) 10 MG tablet, Take 1 tablet (10 mg total) by mouth daily., Disp: 90 tablet, Rfl: 1   atorvastatin (LIPITOR) 20 MG tablet, Take 1 tablet (20 mg total) by mouth daily., Disp: 90 tablet, Rfl: 1   cetirizine (ZYRTEC ALLERGY) 10 MG tablet, Take 1 tablet (10 mg total) by mouth daily., Disp: 30 tablet, Rfl: 2   Cholecalciferol (VITAMIN D3) 125 MCG (5000 UT) CAPS, Take by mouth., Disp: , Rfl:    valsartan (DIOVAN) 160 MG tablet, Take 1 tablet (160 mg total) by mouth daily., Disp: 90 tablet, Rfl: 2   nystatin cream (MYCOSTATIN), Apply 1 Application topically 2 (two)  times daily. Please add use as needed (Patient not taking: Reported on 10/10/2022), Disp: 45 g, Rfl: 1   No Known Allergies   Review of Systems  Constitutional: Negative.   Eyes:  Negative for blurred vision.  Respiratory: Negative.    Cardiovascular: Negative.  Negative for palpitations.  Gastrointestinal: Negative.   Neurological: Negative.   Psychiatric/Behavioral:  Positive for dysphoric mood.      Today's Vitals   10/10/22 0934 10/10/22 1029  BP: (!) 132/90 130/80  Pulse: 92   Temp: 98 F (36.7 C)   SpO2: 98%   Weight: 215 lb 6.4 oz (97.7 kg)   Height: 5\' 3"  (1.6 m)    Body mass index is 38.16 kg/m.  Wt Readings from Last 3 Encounters:  10/10/22 215 lb 6.4 oz (97.7 kg)  07/11/22 212 lb (96.2 kg)  03/29/22 212 lb 3.2 oz (96.3 kg)     Objective:  Physical Exam Vitals and nursing note reviewed.  Constitutional:      Appearance: Normal appearance. She is obese.  HENT:     Head: Normocephalic and atraumatic.  Eyes:     Extraocular Movements: Extraocular movements intact.  Cardiovascular:     Rate and Rhythm: Normal rate and regular rhythm.     Heart sounds: Normal heart sounds.  Pulmonary:     Effort: Pulmonary effort is normal.     Breath sounds: Normal breath sounds.  Musculoskeletal:     Cervical back: Normal range of motion.  Skin:    General: Skin is warm.  Neurological:     General: No focal deficit present.     Mental Status: She is alert.  Psychiatric:        Mood and Affect: Mood normal.        Behavior: Behavior normal.         Assessment And Plan:  Essential hypertension, benign Assessment & Plan: Chronic, fair control. She is currently grieving. No med changes today. She will continue with amlodipine 10mg  and valsartan 160mg  daily. Reminded to follow low sodium diet. She will f/u in 3 months for her physical examination.   Orders: -     CMP14+EGFR -     TSH  Other abnormal glucose Assessment & Plan: Previous labs reviewed, her A1c has  been elevated in the past. I will check an A1c today. Reminded to avoid refined sugars including sugary drinks/foods and processed meats including bacon, sausages and deli meats.    Orders: -     Hemoglobin A1c  Grief Assessment & Plan: I will refer her to Hospice for grief counseling. She is in agreement with treatment plan.    Class 2 severe obesity due to excess calories with serious comorbidity and body mass index (BMI) of 38.0 to 38.9 in adult [facility]) Assessment & Plan: She is encouraged to strive for BMI less than 30 to decrease cardiac risk. Advised to aim for at least 150 minutes of exercise per week.    She is encouraged to strive for BMI less than 30 to decrease cardiac risk. Advised to aim for at least 150 minutes of exercise per week.    Return if symptoms worsen or fail to improve.  Patient was given opportunity to ask questions. Patient verbalized understanding of the plan and was able to repeat key elements of the plan. All questions were answered to their satisfaction.    I, [name], MD, have reviewed all documentation for this visit. The documentation on 10/10/22 for the exam, diagnosis, procedures, and orders are all accurate and complete.   IF YOU HAVE BEEN REFERRED TO A SPECIALIST, IT MAY TAKE 1-2 WEEKS TO SCHEDULE/PROCESS THE REFERRAL. IF YOU HAVE NOT HEARD FROM US/SPECIALIST IN TWO WEEKS, PLEASE GIVE [place] A CALL AT [phone].   THE PATIENT IS ENCOURAGED TO PRACTICE SOCIAL DISTANCING DUE TO THE COVID-19 PANDEMIC.

## 2022-10-10 NOTE — Patient Instructions (Signed)
Hypertension, Adult Hypertension is another name for high blood pressure. High blood pressure forces your heart to work harder to pump blood. This can cause problems over time. There are two numbers in a blood pressure reading. There is a top number (systolic) over a bottom number (diastolic). It is best to have a blood pressure that is below 120/80. What are the causes? The cause of this condition is not known. Some other conditions can lead to high blood pressure. What increases the risk? Some lifestyle factors can make you more likely to develop high blood pressure: Smoking. Not getting enough exercise or physical activity. Being overweight. Having too much fat, sugar, calories, or salt (sodium) in your diet. Drinking too much alcohol. Other risk factors include: Having any of these conditions: Heart disease. Diabetes. High cholesterol. Kidney disease. Obstructive sleep apnea. Having a family history of high blood pressure and high cholesterol. Age. The risk increases with age. Stress. What are the signs or symptoms? High blood pressure may not cause symptoms. Very high blood pressure (hypertensive crisis) may cause: Headache. Fast or uneven heartbeats (palpitations). Shortness of breath. Nosebleed. Vomiting or feeling like you may vomit (nauseous). Changes in how you see. Very bad chest pain. Feeling dizzy. Seizures. How is this treated? This condition is treated by making healthy lifestyle changes, such as: Eating healthy foods. Exercising more. Drinking less alcohol. Your doctor may prescribe medicine if lifestyle changes do not help enough and if: Your top number is above 130. Your bottom number is above 80. Your personal target blood pressure may vary. Follow these instructions at home: Eating and drinking  If told, follow the DASH eating plan. To follow this plan: Fill one half of your plate at each meal with fruits and vegetables. Fill one fourth of your plate  at each meal with whole grains. Whole grains include whole-wheat pasta, brown rice, and whole-grain bread. Eat or drink low-fat dairy products, such as skim milk or low-fat yogurt. Fill one fourth of your plate at each meal with low-fat (lean) proteins. Low-fat proteins include fish, chicken without skin, eggs, beans, and tofu. Avoid fatty meat, cured and processed meat, or chicken with skin. Avoid pre-made or processed food. Limit the amount of salt in your diet to less than 1,500 mg each day. Do not drink alcohol if: Your doctor tells you not to drink. You are pregnant, may be pregnant, or are planning to become pregnant. If you drink alcohol: Limit how much you have to: 0-1 drink a day for women. 0-2 drinks a day for men. Know how much alcohol is in your drink. In the [place] one drink equals one 12 oz bottle of beer (355 mL), one 5 oz glass of wine (148 mL), or one 1 oz glass of hard liquor (44 mL). Lifestyle  Work with your doctor to stay at a healthy weight or to lose weight. Ask your doctor what the best weight is for you. Get at least 30 minutes of exercise that causes your heart to beat faster (aerobic exercise) most days of the week. This may include walking, swimming, or biking. Get at least 30 minutes of exercise that strengthens your muscles (resistance exercise) at least 3 days a week. This may include lifting weights or doing Pilates. Do not smoke or use any products that contain nicotine or tobacco. If you need help quitting, ask your doctor. Check your blood pressure at home as told by your doctor. Keep all follow-up visits. Medicines Take over-the-counter and prescription medicines   only as told by your doctor. Follow directions carefully. Do not skip doses of blood pressure medicine. The medicine does not work as well if you skip doses. Skipping doses also puts you at risk for problems. Ask your doctor about side effects or reactions to medicines that you should watch  for. Contact a doctor if: You think you are having a reaction to the medicine you are taking. You have headaches that keep coming back. You feel dizzy. You have swelling in your ankles. You have trouble with your vision. Get help right away if: You get a very bad headache. You start to feel mixed up (confused). You feel weak or numb. You feel faint. You have very bad pain in your: Chest. Belly (abdomen). You vomit more than once. You have trouble breathing. These symptoms may be an emergency. Get help right away. Call 911. Do not wait to see if the symptoms will go away. Do not drive yourself to the hospital. Summary Hypertension is another name for high blood pressure. High blood pressure forces your heart to work harder to pump blood. For most people, a normal blood pressure is less than 120/80. Making healthy choices can help lower blood pressure. If your blood pressure does not get lower with healthy choices, you may need to take medicine. This information is not intended to replace advice given to you by your health care provider. Make sure you discuss any questions you have with your health care provider. Document Revised: 10/07/2020 Document Reviewed: 10/07/2020 Elsevier Patient Education  2024 Elsevier Inc.  

## 2022-10-10 NOTE — Assessment & Plan Note (Signed)
Previous labs reviewed, her A1c has been elevated in the past. I will check an A1c today. Reminded to avoid refined sugars including sugary drinks/foods and processed meats including bacon, sausages and deli meats.  

## 2022-10-11 LAB — CMP14+EGFR
ALT: 28 [IU]/L (ref 0–32)
AST: 21 [IU]/L (ref 0–40)
Albumin: 4 g/dL (ref 3.9–4.9)
Alkaline Phosphatase: 56 [IU]/L (ref 44–121)
BUN/Creatinine Ratio: 19 (ref 12–28)
BUN: 18 mg/dL (ref 8–27)
Bilirubin Total: 0.3 mg/dL (ref 0.0–1.2)
CO2: 24 mmol/L (ref 20–29)
Calcium: 9.4 mg/dL (ref 8.7–10.3)
Chloride: 106 mmol/L (ref 96–106)
Creatinine, Ser: 0.94 mg/dL (ref 0.57–1.00)
Globulin, Total: 3.1 g/dL (ref 1.5–4.5)
Glucose: 95 mg/dL (ref 70–99)
Potassium: 4.5 mmol/L (ref 3.5–5.2)
Sodium: 142 mmol/L (ref 134–144)
Total Protein: 7.1 g/dL (ref 6.0–8.5)
eGFR: 67 mL/min/{1.73_m2} (ref >59)

## 2022-10-11 LAB — TSH: TSH: 1.22 u[IU]/mL (ref 0.450–4.500)

## 2022-10-11 LAB — HEMOGLOBIN A1C
Est. average glucose Bld gHb Est-mCnc: 123 mg/dL
Hgb A1c MFr Bld: 5.9 % — ABNORMAL HIGH (ref 4.8–5.6)

## 2022-10-24 ENCOUNTER — Encounter: Admit: 2022-10-24 | Discharge: 2022-10-24 | Payer: MEDICARE

## 2022-10-24 ENCOUNTER — Ambulatory Visit: Admit: 2022-10-24 | Discharge: 2022-10-25 | Payer: MEDICARE

## 2022-10-24 DIAGNOSIS — G912 (Idiopathic) normal pressure hydrocephalus: Secondary | ICD-10-CM

## 2022-10-24 DIAGNOSIS — Z96651 Presence of right artificial knee joint: Secondary | ICD-10-CM

## 2022-10-24 DIAGNOSIS — J449 Chronic obstructive pulmonary disease, unspecified: Secondary | ICD-10-CM

## 2022-10-24 DIAGNOSIS — R600 Localized edema: Secondary | ICD-10-CM

## 2022-10-24 DIAGNOSIS — I679 Cerebrovascular disease, unspecified: Secondary | ICD-10-CM

## 2022-10-24 DIAGNOSIS — E039 Hypothyroidism, unspecified: Secondary | ICD-10-CM

## 2022-10-24 DIAGNOSIS — G44221 Chronic tension-type headache, intractable: Secondary | ICD-10-CM

## 2022-10-24 DIAGNOSIS — I10 Essential (primary) hypertension: Secondary | ICD-10-CM

## 2022-10-24 DIAGNOSIS — J410 Simple chronic bronchitis: Secondary | ICD-10-CM

## 2022-10-24 DIAGNOSIS — R519 Headache, unspecified: Secondary | ICD-10-CM

## 2022-10-24 DIAGNOSIS — F419 Anxiety disorder, unspecified: Secondary | ICD-10-CM

## 2022-10-24 DIAGNOSIS — F0393 Unspecified dementia, unspecified severity, with mood disturbance (HCC): Secondary | ICD-10-CM

## 2022-10-24 DIAGNOSIS — G40909 Epilepsy, unspecified, not intractable, without status epilepticus: Secondary | ICD-10-CM

## 2022-10-24 DIAGNOSIS — E876 Hypokalemia: Secondary | ICD-10-CM

## 2022-10-24 DIAGNOSIS — E119 Type 2 diabetes mellitus without complications: Secondary | ICD-10-CM

## 2022-10-24 DIAGNOSIS — D696 Thrombocytopenia, unspecified: Secondary | ICD-10-CM

## 2022-10-24 DIAGNOSIS — K219 Gastro-esophageal reflux disease without esophagitis: Secondary | ICD-10-CM

## 2022-10-24 DIAGNOSIS — J3089 Other allergic rhinitis: Secondary | ICD-10-CM

## 2022-10-24 DIAGNOSIS — T502X5A Adverse effect of carbonic-anhydrase inhibitors, benzothiadiazides and other diuretics, initial encounter: Secondary | ICD-10-CM

## 2022-10-24 NOTE — Patient Instructions
It was a pleasure seeing you today in the Neurosurgery Clinic. Dr. Vonita Moss would like to see you back in clinic in 6 months. Our scheduling team should contact you to schedule your appointments.     If you have any questions or concerns, please don't hesitate to call me at [phone] or send a message via MyChart.  Thank you and take care!     Rayfield Citizen, RN, BSN  Clinical Nurse Coordinator for Dr. Consuela Mimes   [facility]  Phone: [phone] - Fax: [phone]

## 2022-12-24 ENCOUNTER — Emergency Department (HOSPITAL_COMMUNITY)
Admission: EM | Admit: 2022-12-24 | Discharge: 2022-12-24 | Disposition: A | Discharge status: Home / Self Care | Payer: Medicare Other | Attending: Emergency Medicine | Admitting: Emergency Medicine

## 2022-12-24 ENCOUNTER — Emergency Department (HOSPITAL_COMMUNITY): Payer: Medicare Other

## 2022-12-24 DIAGNOSIS — R600 Localized edema: Secondary | ICD-10-CM | POA: Insufficient documentation

## 2022-12-24 DIAGNOSIS — R000 Tachycardia, unspecified: Secondary | ICD-10-CM | POA: Diagnosis not present

## 2022-12-24 DIAGNOSIS — M7989 Other specified soft tissue disorders: Secondary | ICD-10-CM | POA: Diagnosis not present

## 2022-12-24 DIAGNOSIS — M79672 Pain in left foot: Secondary | ICD-10-CM | POA: Insufficient documentation

## 2022-12-24 DIAGNOSIS — M2012 Hallux valgus (acquired), left foot: Secondary | ICD-10-CM | POA: Diagnosis not present

## 2022-12-24 DIAGNOSIS — Z79899 Other long term (current) drug therapy: Secondary | ICD-10-CM | POA: Insufficient documentation

## 2022-12-24 DIAGNOSIS — R0989 Other specified symptoms and signs involving the circulatory and respiratory systems: Secondary | ICD-10-CM | POA: Diagnosis not present

## 2022-12-24 DIAGNOSIS — S92902A Unspecified fracture of left foot, initial encounter for closed fracture: Secondary | ICD-10-CM | POA: Diagnosis not present

## 2022-12-24 DIAGNOSIS — T887XXA Unspecified adverse effect of drug or medicament, initial encounter: Secondary | ICD-10-CM | POA: Diagnosis not present

## 2022-12-24 DIAGNOSIS — S92812A Other fracture of left foot, initial encounter for closed fracture: Secondary | ICD-10-CM | POA: Diagnosis not present

## 2022-12-24 DIAGNOSIS — M19072 Primary osteoarthritis, left ankle and foot: Secondary | ICD-10-CM | POA: Diagnosis not present

## 2022-12-24 DIAGNOSIS — I10 Essential (primary) hypertension: Secondary | ICD-10-CM | POA: Diagnosis not present

## 2022-12-24 LAB — CBC WITH DIFFERENTIAL/PLATELET
Abs Immature Granulocytes: 0.01 10*3/uL (ref 0.00–0.07)
Basophils Absolute: 0 10*3/uL (ref 0.0–0.1)
Basophils Relative: 0 %
Eosinophils Absolute: 0.1 10*3/uL (ref 0.0–0.5)
Eosinophils Relative: 1 %
HCT: 44.6 % (ref 36.0–46.0)
Hemoglobin: 14.7 g/dL (ref 12.0–15.0)
Immature Granulocytes: 0 %
Lymphocytes Relative: 35 %
Lymphs Abs: 2.7 10*3/uL (ref 0.7–4.0)
MCH: 31.1 pg (ref 26.0–34.0)
MCHC: 33 g/dL (ref 30.0–36.0)
MCV: 94.5 fL (ref 80.0–100.0)
Monocytes Absolute: 0.8 10*3/uL (ref 0.1–1.0)
Monocytes Relative: 10 %
Neutro Abs: 4.2 10*3/uL (ref 1.7–7.7)
Neutrophils Relative %: 54 %
Platelets: 246 10*3/uL (ref 150–400)
RBC: 4.72 MIL/uL (ref 3.87–5.11)
RDW: 13.1 % (ref 11.5–15.5)
WBC: 7.7 10*3/uL (ref 4.0–10.5)
nRBC: 0 % (ref 0.0–0.2)

## 2022-12-24 LAB — BASIC METABOLIC PANEL
Anion gap: 9 (ref 5–15)
BUN: 15 mg/dL (ref 8–23)
CO2: 27 mmol/L (ref 22–32)
Calcium: 9.6 mg/dL (ref 8.9–10.3)
Chloride: 104 mmol/L (ref 98–111)
Creatinine, Ser: 1.02 mg/dL — ABNORMAL HIGH (ref 0.44–1.00)
GFR, Estimated: >60 mL/min (ref >60)
Glucose, Bld: 108 mg/dL — ABNORMAL HIGH (ref 70–99)
Potassium: 3.8 mmol/L (ref 3.5–5.1)
Sodium: 140 mmol/L (ref 135–145)

## 2022-12-24 LAB — BRAIN NATRIURETIC PEPTIDE: B Natriuretic Peptide: 14.2 pg/mL (ref 0.0–100.0)

## 2022-12-24 NOTE — ED Provider Notes (Signed)
[place] EMERGENCY DEPARTMENT AT [facility] Provider Note   CSN: [id number] Arrival date & time: 12/24/22  6962     History  Chief Complaint  Patient presents with   Foot Pain    Carolyn Baker is a 67 y.o. female history of hypertension presented with left foot pain that began this morning.  Patient states she woke up with the foot pain but denies fevers, redness, wounds, paresthesias, new onset weakness.  Patient states that hurts to walk.  Patient was notes that over the past 3 weeks both of her legs have appeared swollen.  Patient has history of CHF but does note she is on 10 mg of amlodipine.  Patient denies chest pain, shortness of breath, cough, abdominal pain, nauseous vomiting.   Home Medications Prior to Admission medications   Medication Sig Start Date End Date Taking? Authorizing Provider  amLODipine (NORVASC) 10 MG tablet Take 1 tablet (10 mg total) by mouth daily. 02/24/22 02/24/23  Carolyn Peng, MD  atorvastatin (LIPITOR) 20 MG tablet Take 1 tablet (20 mg total) by mouth daily. 02/24/22   Carolyn Peng, MD  cetirizine (ZYRTEC ALLERGY) 10 MG tablet Take 1 tablet (10 mg total) by mouth daily. 07/11/22 07/11/23  Carolyn Peng, MD  Cholecalciferol (VITAMIN D3) 125 MCG (5000 UT) CAPS Take by mouth.    [provider]  nystatin cream (MYCOSTATIN) Apply 1 Application topically 2 (two) times daily. Please add use as needed Patient not taking: Reported on 10/10/2022 06/16/21   Carolyn Peng, MD  valsartan (DIOVAN) 160 MG tablet Take 1 tablet (160 mg total) by mouth daily. 02/24/22   Carolyn Peng, MD      Allergies    Patient has no known allergies.    Review of Systems   Review of Systems  Physical Exam Updated Vital Signs BP (!) 168/102 (BP Location: Right Arm)   Pulse (!) 106   Temp 98.1 F (36.7 C) (Oral)   Resp 16   SpO2 94%  Physical Exam Vitals reviewed.  Constitutional:      General: She is not in acute distress. HENT:     Head:  Normocephalic and atraumatic.  Eyes:     Extraocular Movements: Extraocular movements intact.     Conjunctiva/sclera: Conjunctivae normal.     Pupils: Pupils are equal, round, and reactive to light.  Cardiovascular:     Rate and Rhythm: Regular rhythm. Tachycardia present.     Pulses: Normal pulses.     Heart sounds: Normal heart sounds.     Comments: 2+ bilateral radial/dorsalis pedis pulses with slightly tachycardic Pulmonary:     Effort: Pulmonary effort is normal. No respiratory distress.     Breath sounds: Normal breath sounds.  Abdominal:     Palpations: Abdomen is soft.     Tenderness: There is no abdominal tenderness. There is no guarding or rebound.  Musculoskeletal:        General: Normal range of motion.     Cervical back: Normal range of motion and neck supple.     Comments: 5 out of 5 bilateral grip/leg extension strength 1+ bilateral pitting edema Tender palpation on left fifth metatarsal without bony abnormalities or crepitus Saw compartments Pain not out of proportion 5 5 ankle flexion is dorsiflexion bilaterally, able to toes  Skin:    General: Skin is warm and dry.     Capillary Refill: Capillary refill takes less than 2 seconds.  Neurological:     General: No focal deficit present.  Mental Status: She is alert and oriented to person, place, and time.     Comments: Sensation intact in all 4 limbs  Psychiatric:        Mood and Affect: Mood normal.     ED Results / Procedures / Treatments   Labs (all labs ordered are listed, but only abnormal results are displayed) Labs Reviewed  BASIC METABOLIC PANEL - Abnormal; Notable for the following components:      Result Value   Glucose, Bld 108 (*)    Creatinine, Ser 1.02 (*)    All other components within normal limits  CBC WITH DIFFERENTIAL/PLATELET  BRAIN NATRIURETIC PEPTIDE    EKG None  Radiology DG Chest Port 1 View Result Date: 12/24/2022 CLINICAL DATA:  Bilateral lower extremity swelling.  EXAM: PORTABLE CHEST 1 VIEW COMPARISON:  None Available. FINDINGS: Low lung volumes. The heart size and mediastinal contours are within normal limits. No focal consolidation, sizeable pleural effusion, or pneumothorax. No acute osseous abnormality. IMPRESSION: Low lung volumes.  No acute cardiopulmonary findings. Electronically Signed   By: Carolyn Baker M.D.   On: 12/24/2022 12:21   DG Foot Complete Left Result Date: 12/24/2022 CLINICAL DATA:  Swelling and pain for 2 weeks. Prior fracture to foot. EXAM: LEFT FOOT - COMPLETE 3+ VIEW COMPARISON:  None Available. FINDINGS: The bones appear mildly osteopenic. No signs of acute fracture or subluxation. Dorsal soft tissue swelling noted overlying the tarsal metatarsal joints. Hallux valgus deformity with degenerative changes at the first MTP joint. Posterior calcaneal heel spur. IMPRESSION: 1. No acute bony abnormality. 2. Dorsal soft tissue swelling. 3. 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 Electronically Signed   By: Carolyn Baker M.D.   On: 12/24/2022 10:52    Procedures Procedures    Medications Ordered in ED Medications - No data to display  ED Course/ Medical Decision Making/ A&P                                 Medical Decision Making Amount and/or Complexity of Data Reviewed Labs: ordered. Radiology: ordered.   Carolyn Baker 67 y.o. presented today for bilateral leg edema, left foot pain. Working DDx that I considered at this time includes, but not limited to, dependent edema, venous insufficiency, thrombophlebitis, secondary to medications, CHF, edema, AKI, nephrotic syndrome, contusion, arthritis, septic arthritis, gout.  R/o DDx: dependent edema, venous insufficiency, thrombophlebitis,CHF, edema, AKI, nephrotic syndrome,  septic arthritis, gout: These are considered less likely due to history of present illness, physical exam, labs/imaging findings.  Review of prior external notes: 10/10/2022 office  visit  Unique Tests and My Interpretation:  CBC: Unremarkable BMP: Unremarkable BNP: Negative Chest x-ray: Unremarkable Left foot x-ray: First MTP arthritis  Social Determinants of Health: none  Discussion with Independent Historian:  Son  Discussion of Management of Tests: None  Risk: Low: based on diagnostic testing/clinical impression and treatment plan  Risk Stratification Score: None  Plan: On exam patient was no acute distress stable vitals.  On exam patient is neuro vas intact slightly tender to palpation on the lateral aspect of her left foot on the fifth metatarsal however no bony abnormalities were noted.  X-rays negative for fractures.  Suspect she could have a bruise as this is atraumatic and does not appear gout-like or septic that would require further evaluation.  Patient think she may have twisted her ankle but is unsure of any event that would have  caused this.  Patient is also not taking any medications for this and so we will recommend she takes Tylenol every 6 hours along with rest.  Will give ASO brace.  Patient also does have pitting edema bilaterally.  Patient is no previous history of CHF and I spoke to the patient in which her decision making patient wants to get labs and make sure she is not of heart failure.  Patient denied any chest pain or shortness of breath and lungs are clear to auscultation bilaterally.  Patient is on 10 mg of amlodipine so do suspect this could be a side effect of the medication.  Labs and imaging came back reassuring so do suspect this is more related to her amlodipine as a medication side effect we will have her follow-up with her primary care provider.  Patient was given return precautions. Patient stable for discharge at this time.  Patient verbalized understanding of plan.  This chart was dictated using voice recognition software.  Despite best efforts to proofread,  errors can occur which can change the documentation  meaning.         Final Clinical Impression(s) / ED Diagnoses Final diagnoses:  Foot pain, left  Medication side effect    Rx / DC Orders ED Discharge Orders     None         Remi Deter 12/24/22 1314    Gwyneth Sprout, MD 12/24/22 1517

## 2022-12-24 NOTE — ED Triage Notes (Signed)
Pt c/o L foot pain for two weeks. Pt denies known injury. Pt also states she's been having some leg swelling. Denies hx of blood clots, not on anticoagulation, no SOB, no hx of CHF.

## 2022-12-24 NOTE — ED Notes (Signed)
Awaiting patient from lobby 

## 2022-12-24 NOTE — Discharge Instructions (Signed)
Please follow-up with your primary care provider regards recent ER visit.  Today your labs and imaging were reassuring and your leg swelling is most likely from the amlodipine.  Please take Tylenol every 6 hours as needed for pain and ice your foot and if symptoms change or worsen please return to the ER.

## 2022-12-24 NOTE — ED Notes (Signed)
James(PA) talking to pt about results. This NT spoke with PA about the EKG, PA stated there was no need for the EKG.

## 2022-12-24 NOTE — ED Notes (Addendum)
Pt reports the outside of the left foot is very painful to touch.  Pt states this started yesterday and has taken tylenol PO for pain.

## 2022-12-24 NOTE — Progress Notes (Signed)
Orthopedic Tech Progress Note Patient Details:  Carolyn Baker 06-30-1955 [id number]  Ortho Devices Type of Ortho Device: ASO Ortho Device/Splint Location: LLE Ortho Device/Splint Interventions: Ordered, Application, Adjustment   Post Interventions Patient Tolerated: Well Instructions Provided: Adjustment of device, Care of device, Poper ambulation with device  [name] 12/24/2022, 1:27 PM

## 2022-12-25 ENCOUNTER — Telehealth: Payer: Self-pay

## 2022-12-25 NOTE — Transitions of Care (Post Inpatient/ED Visit) (Signed)
   12/25/2022  Name: Carolyn Baker MRN: [id number] DOB: November 12, 1955  Today's TOC FU Call Status: Today's TOC FU Call Status:: Successful TOC FU Call Completed TOC FU Call Complete Date: 12/25/22 Patient's Name and Date of Birth confirmed.  Transition Care Management Follow-up Telephone Call Date of Discharge: 12/24/22 Discharge Facility: [facility]) Type of Discharge: Inpatient Admission Primary Inpatient Discharge Diagnosis:: foot pain How have you been since you were released from the hospital?: Better Any questions or concerns?: No  Items Reviewed: Did you receive and understand the discharge instructions provided?: Yes Medications obtained,verified, and reconciled?: Yes (Medications Reviewed) Any new allergies since your discharge?: No Dietary orders reviewed?: No Do you have support at home?: Yes People in Home: child(ren), adult  Medications Reviewed Today: Medications Reviewed Today   Medications were not reviewed in this encounter     Home Care and Equipment/Supplies: Were Home Health Services Ordered?: No Any new equipment or medical supplies ordered?: No  Functional Questionnaire: Do you need assistance with bathing/showering or dressing?: No Do you need assistance with meal preparation?: No Do you need assistance with eating?: No Do you have difficulty maintaining continence: No Do you need assistance with getting out of bed/getting out of a chair/moving?: No Do you have difficulty managing or taking your medications?: No  Follow up appointments reviewed: PCP Follow-up appointment confirmed?: No (patient declined.) MD Provider Line [phone] Given: Yes Specialist Hospital Follow-up appointment confirmed?: No (patient reports if it gets worse she will go to her foot doctor.) Reason Specialist Follow-Up Not Confirmed: Appointment Sceduled by [facility] Calling Clinician Do you need transportation to your follow-up appointment?: No Do you understand  care options if your condition(s) worsen?: Yes-patient verbalized understanding    SIGNATURE Lisabeth Devoid, CMA

## 2023-01-10 ENCOUNTER — Ambulatory Visit: Payer: Medicare Other

## 2023-01-10 VITALS — BP 126/70 | HR 96 | Temp 98.3°F | Ht 64.0 in | Wt 220.6 lb

## 2023-01-10 DIAGNOSIS — Z0000 Encounter for general adult medical examination without abnormal findings: Secondary | ICD-10-CM

## 2023-01-10 NOTE — Progress Notes (Signed)
 Subjective:   Carolyn Baker is a 68 y.o. female who presents for an Initial Medicare Annual Wellness Visit.  Visit Complete: In person    Cardiac Risk Factors include: advanced age (>77men, >16 women);hypertension;obesity (BMI >30kg/m2)     Objective:    Today's Vitals   01/10/23 0954  BP: 126/70  Pulse: 96  Temp: 98.3 F (36.8 C)  TempSrc: Oral  SpO2: 96%  Weight: 220 lb 9.6 oz (100.1 kg)  Height: 5' 4 (1.626 m)   Body mass index is 37.87 kg/m.     01/10/2023   10:03 AM 12/24/2022   10:18 AM 01/04/2022   11:40 AM 08/28/2016    3:04 PM  Advanced Directives  Does Patient Have a Medical Advance Directive? No No No No  Would patient like information on creating a medical advance directive? No - Patient declined  Yes (MAU/Ambulatory/Procedural Areas - Information given) No - Patient declined    Current Medications (verified) Outpatient Encounter Medications as of 01/10/2023  Medication Sig   amLODipine  (NORVASC ) 10 MG tablet Take 1 tablet (10 mg total) by mouth daily.   atorvastatin  (LIPITOR) 20 MG tablet Take 1 tablet (20 mg total) by mouth daily.   cetirizine  (ZYRTEC  ALLERGY) 10 MG tablet Take 1 tablet (10 mg total) by mouth daily.   Cholecalciferol (VITAMIN D3) 125 MCG (5000 UT) CAPS Take by mouth.   nystatin  cream (MYCOSTATIN ) Apply 1 Application topically 2 (two) times daily. Please add use as needed   valsartan  (DIOVAN ) 160 MG tablet Take 1 tablet (160 mg total) by mouth daily.   No facility-administered encounter medications on file as of 01/10/2023.    Allergies (verified) Patient has no known allergies.   History: Past Medical History:  Diagnosis Date   Hypertension    Past Surgical History:  Procedure Laterality Date   PARTIAL HYSTERECTOMY  1998   Family History  Problem Relation Age of Onset   Stroke Mother    Dementia Mother    Prostate cancer Father    Breast cancer Maternal Aunt    Social History   Socioeconomic History   Marital  status: Single    Spouse name: Not on file   Number of children: Not on file   Years of education: Not on file   Highest education level: Not on file  Occupational History   Not on file  Tobacco Use   Smoking status: Never   Smokeless tobacco: Never  Vaping Use   Vaping status: Never Used  Substance and Sexual Activity   Alcohol use: Not Currently    Comment: rarely   Drug use: Never   Sexual activity: Not on file  Other Topics Concern   Not on file  Social History Narrative   Not on file   Social Drivers of Health   Financial Resource Strain: Low Risk  (01/10/2023)   Overall Financial Resource Strain (CARDIA)    Difficulty of Paying Living Expenses: Not hard at all  Food Insecurity: No Food Insecurity (01/10/2023)   Hunger Vital Sign    Worried About Running Out of Food in the Last Year: Never true    Ran Out of Food in the Last Year: Never true  Transportation Needs: No Transportation Needs (01/10/2023)   PRAPARE - Administrator, Civil Service (Medical): No    Lack of Transportation (Non-Medical): No  Physical Activity: Inactive (01/10/2023)   Exercise Vital Sign    Days of Exercise per Week: 0 days  Minutes of Exercise per Session: 0 min  Stress: No Stress Concern Present (01/10/2023)   [employer] of Occupational Health - Occupational Stress Questionnaire    Feeling of Stress : Not at all  Social Connections: Moderately Isolated (01/10/2023)   Social Connection and Isolation Panel [NHANES]    Frequency of Communication with Friends and Family: More than three times a week    Frequency of Social Gatherings with Friends and Family: More than three times a week    Attends Religious Services: More than 4 times per year    Active Member of [employer] or Organizations: No    Attends Engineer, Structural: Never    Marital Status: Never married    Tobacco Counseling Counseling given: Not Answered   Clinical Intake:  Pre-visit preparation completed:  Yes  Pain : No/denies pain     Nutritional Status: BMI > 30  Obese Nutritional Risks: None Diabetes: No  How often do you need to have someone help you when you read instructions, pamphlets, or other written materials from your doctor or pharmacy?: 1 - Never  Interpreter Needed?: No  Information entered by :: NAllen LPN   Activities of Daily Living    01/10/2023    9:55 AM  In your present state of health, do you have any difficulty performing the following activities:  Hearing? 0  Vision? 0  Difficulty concentrating or making decisions? 0  Walking or climbing stairs? 0  Dressing or bathing? 0  Doing errands, shopping? 0  Preparing Food and eating ? N  Using the Toilet? N  In the past six months, have you accidently leaked urine? Y  Comment when she was sick, but has resolved  Do you have problems with loss of bowel control? N  Managing your Medications? N  Managing your Finances? N  Housekeeping or managing your Housekeeping? N    Patient Care Team: Jarold Medici, MD as PCP - General (Internal Medicine)  Indicate any recent Medical Services you may have received from other than [facility] providers in the past year (date may be approximate).     Assessment:   This is a routine wellness examination for Carolyn Baker.  Hearing/Vision screen Hearing Screening - Comments:: Denies hearing issues Vision Screening - Comments:: Regular eye exams, [facility]   Goals Addressed             This Visit's Progress    Patient Stated       01/10/2023, wants to lose weight       Depression Screen    01/10/2023   10:04 AM 01/24/2022   10:06 AM 01/04/2022   11:13 AM 03/28/2021    3:18 PM 02/17/2020    9:19 AM 12/25/2018    2:39 PM 08/13/2018   12:30 PM  PHQ 2/9 Scores  PHQ - 2 Score 0 0 0 0 0 0 0    Fall Risk    01/10/2023   10:04 AM 01/24/2022   10:06 AM 01/04/2022   11:13 AM 06/16/2021    9:28 AM 12/25/2018    2:38 PM  Fall Risk   Falls in the past year? 0 0 0 0 0  Number falls  in past yr: 0 0 0 0   Injury with Fall? 0 0 0 0   Risk for fall due to : Medication side effect No Fall Risks No Fall Risks No Fall Risks   Follow up Falls prevention discussed;Falls evaluation completed Falls evaluation completed Falls evaluation completed Falls evaluation completed  MEDICARE RISK AT HOME: Medicare Risk at Home Any stairs in or around the home?: Yes If so, are there any without handrails?: No Home free of loose throw rugs in walkways, pet beds, electrical cords, etc?: Yes Adequate lighting in your home to reduce risk of falls?: Yes Life alert?: No Use of a cane, walker or w/c?: No Grab bars in the bathroom?: No Shower chair or bench in shower?: No Elevated toilet seat or a handicapped toilet?: Yes  TIMED UP AND GO:  Was the test performed? Yes  Length of time to ambulate 10 feet: 5 sec Gait steady and fast without use of assistive device    Cognitive Function:        01/10/2023   10:04 AM 01/04/2022   11:22 AM  6CIT Screen  What Year? 0 points 0 points  What month? 0 points 0 points  What time? 0 points 0 points  Count back from 20 0 points 0 points  Months in reverse 0 points 0 points  Repeat phrase 0 points 4 points  Total Score 0 points 4 points    Immunizations Immunization History  Administered Date(s) Administered   PFIZER(Purple Top)SARS-COV-2 Vaccination 04/10/2019, 05/06/2019    TDAP status: Up to date  Flu Vaccine status: Declined, Education has been provided regarding the importance of this vaccine but patient still declined. Advised may receive this vaccine at local pharmacy or Health Dept. Aware to provide a copy of the vaccination record if obtained from local pharmacy or Health Dept. Verbalized acceptance and understanding.  Pneumococcal vaccine status: Declined,  Education has been provided regarding the importance of this vaccine but patient still declined. Advised may receive this vaccine at local pharmacy or Health Dept. Aware to  provide a copy of the vaccination record if obtained from local pharmacy or Health Dept. Verbalized acceptance and understanding.   Covid-19 vaccine status: Information provided on how to obtain vaccines.   Qualifies for Shingles Vaccine? Yes   Zostavax completed No   Shingrix Completed?: No.    Education has been provided regarding the importance of this vaccine. Patient has been advised to call insurance company to determine out of pocket expense if they have not yet received this vaccine. Advised may also receive vaccine at local pharmacy or Health Dept. Verbalized acceptance and understanding.  Screening Tests Health Maintenance  Topic Date Due   Colonoscopy  01/18/2023   Zoster Vaccines- Shingrix (1 of 2) 01/10/2023 (Originally 04/06/1974)   COVID-19 Vaccine (3 - Pfizer risk series) 01/26/2023 (Originally 06/03/2019)   INFLUENZA VACCINE  04/02/2023 (Originally 08/03/2022)   Pneumonia Vaccine 65+ Years old (1 of 1 - PCV) 10/10/2023 (Originally 04/05/2021)   Medicare Annual Wellness (AWV)  01/10/2024   MAMMOGRAM  04/17/2024   DEXA SCAN  Completed   Hepatitis C Screening  Completed   HPV VACCINES  Aged Out   DTaP/Tdap/Td  Discontinued    Health Maintenance  Health Maintenance Due  Topic Date Due   Colonoscopy  01/18/2023    Colorectal cancer screening: Type of screening: Colonoscopy. Completed 01/17/2018. Repeat every 5 years  Mammogram status: Completed 04/18/2022. Repeat every year  Bone Density status: Completed 04/18/2022.   Lung Cancer Screening: (Low Dose CT Chest recommended if Age 60-80 years, 20 pack-year currently smoking OR have quit w/in 15years.) does not qualify.   Lung Cancer Screening Referral: no  Additional Screening:  Hepatitis C Screening: does qualify; Completed 05/21/2012  Vision Screening: Recommended annual ophthalmology exams for early detection of glaucoma and other  disorders of the eye. Is the patient up to date with their annual eye exam?  Yes  Who is  the provider or what is the name of the office in which the patient attends annual eye exams? [facility] If pt is not established with a provider, would they like to be referred to a provider to establish care? No .   Dental Screening: Recommended annual dental exams for proper oral hygiene  Diabetic Foot Exam: n/a  Community Resource Referral / Chronic Care Management: CRR required this visit?  No   CCM required this visit?  No     Plan:     I have personally reviewed and noted the following in the patient's chart:   Medical and social history Use of alcohol, tobacco or illicit drugs  Current medications and supplements including opioid prescriptions. Patient is not currently taking opioid prescriptions. Functional ability and status Nutritional status Physical activity Advanced directives List of other physicians Hospitalizations, surgeries, and ER visits in previous 12 months Vitals Screenings to include cognitive, depression, and falls Referrals and appointments  In addition, I have reviewed and discussed with patient certain preventive protocols, quality metrics, and best practice recommendations. A written personalized care plan for preventive services as well as general preventive health recommendations were provided to patient.     Ardella FORBES Dawn, LPN   08/02/7972   After Visit Summary: (In Person-Printed) AVS printed and given to the patient  Nurse Notes: none

## 2023-01-10 NOTE — Patient Instructions (Signed)
 Ms. Demars , Thank you for taking time to come for your Medicare Wellness Visit. I appreciate your ongoing commitment to your health goals. Please review the following plan we discussed and let me know if I can assist you in the future.   Referrals/Orders/Follow-Ups/Clinician Recommendations: none  This is a list of the screening recommended for you and due dates:  Health Maintenance  Topic Date Due   Colon Cancer Screening  01/18/2023   Zoster (Shingles) Vaccine (1 of 2) 01/10/2023*   COVID-19 Vaccine (3 - Pfizer risk series) 01/26/2023*   Flu Shot  04/02/2023*   Pneumonia Vaccine (1 of 1 - PCV) 10/10/2023*   Medicare Annual Wellness Visit  01/10/2024   Mammogram  04/17/2024   DEXA scan (bone density measurement)  Completed   Hepatitis C Screening  Completed   HPV Vaccine  Aged Out   DTaP/Tdap/Td vaccine  Discontinued  *Topic was postponed. The date shown is not the original due date.    Advanced directives: (Provided) Advance directive discussed with you today. I have provided a copy for you to complete at home and have notarized. Once this is complete, please bring a copy in to our office so we can scan it into your chart.   Next Medicare Annual Wellness Visit scheduled for next year: No, office will schedule  Insert Preventive Care attachment Insert FALL PREVENTION attachment if needed

## 2023-01-18 ENCOUNTER — Ambulatory Visit: Payer: Medicare Other | Admitting: Internal Medicine

## 2023-01-18 ENCOUNTER — Encounter: Payer: Self-pay | Admitting: Internal Medicine

## 2023-01-18 VITALS — BP 130/84 | HR 98 | Temp 97.8°F | Ht 64.0 in | Wt 219.4 lb

## 2023-01-18 DIAGNOSIS — R7309 Other abnormal glucose: Secondary | ICD-10-CM

## 2023-01-18 DIAGNOSIS — Z0000 Encounter for general adult medical examination without abnormal findings: Secondary | ICD-10-CM | POA: Diagnosis not present

## 2023-01-18 DIAGNOSIS — I10 Essential (primary) hypertension: Secondary | ICD-10-CM

## 2023-01-18 DIAGNOSIS — E7800 Pure hypercholesterolemia, unspecified: Secondary | ICD-10-CM | POA: Diagnosis not present

## 2023-01-18 DIAGNOSIS — E66812 Obesity, class 2: Secondary | ICD-10-CM

## 2023-01-18 DIAGNOSIS — Z6837 Body mass index (BMI) 37.0-37.9, adult: Secondary | ICD-10-CM

## 2023-01-18 DIAGNOSIS — Z860109 Personal history of other colon polyps: Secondary | ICD-10-CM | POA: Diagnosis not present

## 2023-01-18 LAB — POCT URINALYSIS DIPSTICK
Bilirubin, UA: NEGATIVE
Blood, UA: NEGATIVE
Glucose, UA: NEGATIVE
Ketones, UA: NEGATIVE
Nitrite, UA: NEGATIVE
Protein, UA: NEGATIVE
Spec Grav, UA: 1.02 (ref 1.010–1.025)
Urobilinogen, UA: 0.2 U/dL
pH, UA: 6.5 (ref 5.0–8.0)

## 2023-01-18 MED ORDER — AMLODIPINE BESYLATE 10 MG PO TABS: 10.0000 mg | ORAL_TABLET | Freq: Every day | ORAL | 1 refills | Status: DC

## 2023-01-18 MED ORDER — VALSARTAN 160 MG PO TABS: 160.0000 mg | ORAL_TABLET | Freq: Every day | ORAL | 2 refills | Status: DC

## 2023-01-18 MED ORDER — ATORVASTATIN CALCIUM 20 MG PO TABS: 20.0000 mg | ORAL_TABLET | Freq: Every day | ORAL | 1 refills | Status: DC

## 2023-01-18 NOTE — Assessment & Plan Note (Addendum)
Chronic, fair control. EKG Performed, NSR w/o acute changes.  She will continue with amlodipine 10mg  and valsartan 160mg  daily. Reminded to follow low sodium diet. She will f/u in four to six months for re-evaluation.

## 2023-01-18 NOTE — Progress Notes (Signed)
I,Carolyn Baker, CMA,acting as a scribe fo r Carolyn Aliment, MD.,have documented all relevant documentation on the behalf of Carolyn Aliment, MD,as directed by  Carolyn Aliment, MD while in the presence of Carolyn Aliment, MD.  Subjective:    Patient ID: Carolyn Baker , female    DOB: 1955-01-14 , 68 y.o.   MRN: [id number]  Chief Complaint  Patient presents with   Annual Exam   Hypertension    HPI  Patient presents today for annual exam. She reports compliance with medications. Denies headache, chest pain & sob. She has no specific questions or concerns.  She has yet to complete colonoscopy.    Hypertension This is a chronic problem. The current episode started more than 1 year ago. The problem has been gradually improving since onset. The problem is controlled. Pertinent negatives include no blurred vision, chest pain, palpitations or shortness of breath. The current treatment provides moderate improvement. Compliance problems include exercise.      Past Medical History:  Diagnosis Date   Hypertension      Family History  Problem Relation Age of Onset   Stroke Mother    Dementia Mother    Prostate cancer Father    Bladder Cancer Maternal Grandmother    Breast cancer Maternal Aunt      Current Outpatient Medications:    cetirizine (ZYRTEC ALLERGY) 10 MG tablet, Take 1 tablet (10 mg total) by mouth daily., Disp: 30 tablet, Rfl: 2   Cholecalciferol (VITAMIN D3) 125 MCG (5000 UT) CAPS, Take by mouth., Disp: , Rfl:    nystatin cream (MYCOSTATIN), Apply 1 Application topically 2 (two) times daily. Please add use as needed, Disp: 45 g, Rfl: 1   amLODipine (NORVASC) 10 MG tablet, Take 1 tablet (10 mg total) by mouth daily., Disp: 90 tablet, Rfl: 1   atorvastatin (LIPITOR) 20 MG tablet, Take 1 tablet (20 mg total) by mouth daily., Disp: 90 tablet, Rfl: 1   valsartan (DIOVAN) 160 MG tablet, Take 1 tablet (160 mg total) by mouth daily., Disp: 90 tablet, Rfl: 2   No Known  Allergies    The patient states she uses status post hysterectomy for birth control. No LMP recorded. Patient has had a hysterectomy.. Negative for Dysmenorrhea. Negative for: breast discharge, breast lump(s), breast pain and breast self exam. Associated symptoms include abnormal vaginal bleeding. Pertinent negatives include abnormal bleeding (hematology), anxiety, decreased libido, depression, difficulty falling sleep, dyspareunia, history of infertility, nocturia, sexual dysfunction, sleep disturbances, urinary incontinence, urinary urgency, vaginal discharge and vaginal itching. Diet regular.The patient states her exercise level is  minimal.  . The patient's tobacco use is:  Social History   Tobacco Use  Smoking Status Never  Smokeless Tobacco Never  . She has been exposed to passive smoke. The patient's alcohol use is:  Social History   Substance and Sexual Activity  Alcohol Use Not Currently   Comment: rarely    Review of Systems  Constitutional: Negative.   HENT: Negative.    Eyes: Negative.  Negative for blurred vision.  Respiratory: Negative.  Negative for shortness of breath.   Cardiovascular: Negative.  Negative for chest pain and palpitations.  Gastrointestinal: Negative.   Endocrine: Negative.   Genitourinary: Negative.   Musculoskeletal: Negative.   Skin: Negative.   Allergic/Immunologic: Negative.   Neurological: Negative.   Hematological: Negative.   Psychiatric/Behavioral: Negative.       Today's Vitals   01/18/23 1035  BP: 130/84  Pulse: 98  Temp:  97.8 F (36.6 C)  SpO2: 98%  Weight: 219 lb 6.4 oz (99.5 kg)  Height: 5\' 4"  (1.626 m)   Body mass index is 37.66 kg/m.  Wt Readings from Last 3 Encounters:  01/18/23 219 lb 6.4 oz (99.5 kg)  01/10/23 220 lb 9.6 oz (100.1 kg)  10/10/22 215 lb 6.4 oz (97.7 kg)    BP Readings from Last 3 Encounters:  01/18/23 130/84  01/10/23 126/70  12/24/22 (!) 168/102     Objective:  Physical Exam Vitals and  nursing note reviewed.  Constitutional:      Appearance: Normal appearance. She is obese.  HENT:     Head: Normocephalic and atraumatic.     Right Ear: Tympanic membrane, ear canal and external ear normal.     Left Ear: Tympanic membrane, ear canal and external ear normal.     Nose: Nose normal.     Mouth/Throat:     Mouth: Mucous membranes are moist.     Pharynx: Oropharynx is clear.  Eyes:     Extraocular Movements: Extraocular movements intact.     Conjunctiva/sclera: Conjunctivae normal.     Pupils: Pupils are equal, round, and reactive to light.  Cardiovascular:     Rate and Rhythm: Normal rate and regular rhythm.     Pulses: Normal pulses.     Heart sounds: Normal heart sounds.  Pulmonary:     Effort: Pulmonary effort is normal.     Breath sounds: Normal breath sounds.  Chest:  Breasts:    Tanner Score is 5.     Right: Normal.     Left: Normal.  Abdominal:     General: Bowel sounds are normal.     Palpations: Abdomen is soft.     Comments: Obese, soft.   Genitourinary:    Comments: deferred Musculoskeletal:        General: Normal range of motion.     Cervical back: Normal range of motion and neck supple.  Skin:    General: Skin is warm and dry.  Neurological:     General: No focal deficit present.     Mental Status: She is alert and oriented to person, place, and time.  Psychiatric:        Mood and Affect: Mood normal.        Behavior: Behavior normal.         Assessment And Plan:     Encounter for annual health examination Assessment & Plan: A full exam was performed.  Importance of monthly self breast exams was discussed with the patient.  She is advised to get 30-45 minutes of regular exercise, no less than four to five days per week. Both weight-bearing and aerobic exercises are recommended.  She is advised to follow a healthy diet with at least six fruits/veggies per day, decrease intake of red meat and other saturated fats and to increase fish intake to  twice weekly.  Meats/fish should not be fried -- baked, boiled or broiled is preferable. It is also important to cut back on your sugar intake.  Be sure to read labels - try to avoid anything with added sugar, high fructose corn syrup or other sweeteners.  If you must use a sweetener, you can try stevia or monkfruit.  It is also important to avoid artificially sweetened foods/beverages and diet drinks. Lastly, wear SPF 50 sunscreen on exposed skin and when in direct sunlight for an extended period of time.  Be sure to avoid fast food restaurants and aim  for at least 60 ounces of water daily.       Essential hypertension, benign Assessment & Plan: Chronic, fair control. EKG Performed, NSR w/o acute changes.  She will continue with amlodipine 10mg  and valsartan 160mg  daily. Reminded to follow low sodium diet. She will f/u in four to six months for re-evaluation.   Orders: -     POCT urinalysis dipstick -     Microalbumin / creatinine urine ratio -     EKG 12-Lead -     CMP14+EGFR -     Lipid panel  Pure hypercholesterolemia Assessment & Plan: Chronic, encouraged to follow heart healthy lifestyle. She will continue with atorvastatin 20mg  daily. If Lp(a) is elevated, LDL goal should be less than 70.    Orders: -     Lipoprotein A (LPA)  Other abnormal glucose Assessment & Plan: Previous labs reviewed, her A1c has been elevated in the past. I will check an A1c today. Reminded to avoid refined sugars including sugary drinks/foods and processed meats including bacon, sausages and deli meats.    Orders: -     Hemoglobin A1c  Class 2 severe obesity due to excess calories with serious comorbidity and body mass index (BMI) of 37.0 to 37.9 in adult [facility]) Assessment & Plan: She is encouraged to strive for BMI less than 30 to decrease cardiac risk. Advised to aim for at least 150 minutes of exercise per week.    Personal history of other colon polyps Assessment & Plan: Last colonoscopy was  performed in 2020. She was due for repeat study in 2023. She has upcoming GI appt scheduled for 01/29/23.    Other orders -     amLODIPine Besylate; Take 1 tablet (10 mg total) by mouth daily.  Dispense: 90 tablet; Refill: 1 -     Atorvastatin Calcium; Take 1 tablet (20 mg total) by mouth daily.  Dispense: 90 tablet; Refill: 1 -     Valsartan; Take 1 tablet (160 mg total) by mouth daily.  Dispense: 90 tablet; Refill: 2  She is encouraged to strive for BMI less than 30 to decrease cardiac risk. Advised to aim for at least 150 minutes of exercise per week.    Return in 1 year (on 01/18/2024) for 1 year physical, 4 month bp check. Patient was given opportunity to ask questions. Patient verbalized understanding of the plan and was able to repeat key elements of the plan. All questions were answered to their satisfaction.    I, Carolyn Aliment, MD, have reviewed all documentation for this visit. The documentation on 01/18/23 for the exam, diagnosis, procedures, and orders are all accurate and complete.

## 2023-01-18 NOTE — Assessment & Plan Note (Signed)
Previous labs reviewed, her A1c has been elevated in the past. I will check an A1c today. Reminded to avoid refined sugars including sugary drinks/foods and processed meats including bacon, sausages and deli meats.  

## 2023-01-18 NOTE — Patient Instructions (Signed)

## 2023-01-18 NOTE — Assessment & Plan Note (Signed)
 She is encouraged to strive for BMI less than 30 to decrease cardiac risk. Advised to aim for at least 150 minutes of exercise per week.

## 2023-01-18 NOTE — Assessment & Plan Note (Signed)

## 2023-01-19 LAB — LIPID PANEL
Chol/HDL Ratio: 2.7 {ratio} (ref 0.0–4.4)
Cholesterol, Total: 146 mg/dL (ref 100–199)
HDL: 54 mg/dL (ref >39)
LDL Chol Calc (NIH): 76 mg/dL (ref 0–99)
Triglycerides: 81 mg/dL (ref 0–149)
VLDL Cholesterol Cal: 16 mg/dL (ref 5–40)

## 2023-01-19 LAB — HEMOGLOBIN A1C
Est. average glucose Bld gHb Est-mCnc: 137 mg/dL
Hgb A1c MFr Bld: 6.4 % — ABNORMAL HIGH (ref 4.8–5.6)

## 2023-01-19 LAB — CMP14+EGFR
ALT: 30 [IU]/L (ref 0–32)
AST: 21 [IU]/L (ref 0–40)
Albumin: 4.4 g/dL (ref 3.9–4.9)
Alkaline Phosphatase: 62 [IU]/L (ref 44–121)
BUN/Creatinine Ratio: 16 (ref 12–28)
BUN: 17 mg/dL (ref 8–27)
Bilirubin Total: 0.7 mg/dL (ref 0.0–1.2)
CO2: 23 mmol/L (ref 20–29)
Calcium: 9.7 mg/dL (ref 8.7–10.3)
Chloride: 103 mmol/L (ref 96–106)
Creatinine, Ser: 1.06 mg/dL — ABNORMAL HIGH (ref 0.57–1.00)
Globulin, Total: 3.5 g/dL (ref 1.5–4.5)
Glucose: 106 mg/dL — ABNORMAL HIGH (ref 70–99)
Potassium: 4.2 mmol/L (ref 3.5–5.2)
Sodium: 144 mmol/L (ref 134–144)
Total Protein: 7.9 g/dL (ref 6.0–8.5)
eGFR: 58 mL/min/{1.73_m2} — ABNORMAL LOW (ref >59)

## 2023-01-19 LAB — MICROALBUMIN / CREATININE URINE RATIO
Creatinine, Urine: 181.3 mg/dL
Microalb/Creat Ratio: 2 mg/g{creat} (ref 0–29)
Microalbumin, Urine: 3.3 ug/mL

## 2023-01-19 LAB — LIPOPROTEIN A (LPA): Lipoprotein (a): 187 nmol/L — ABNORMAL HIGH (ref <75.0)

## 2023-01-22 DIAGNOSIS — E7800 Pure hypercholesterolemia, unspecified: Secondary | ICD-10-CM | POA: Insufficient documentation

## 2023-01-22 DIAGNOSIS — Z860109 Personal history of other colon polyps: Secondary | ICD-10-CM | POA: Insufficient documentation

## 2023-01-22 NOTE — Assessment & Plan Note (Signed)
Chronic, encouraged to follow heart healthy lifestyle. She will continue with atorvastatin 20mg  daily. If Lp(a) is elevated, LDL goal should be less than 70.

## 2023-01-22 NOTE — Assessment & Plan Note (Signed)
Last colonoscopy was performed in 2020. She was due for repeat study in 2023. She has upcoming GI appt scheduled for 01/29/23.

## 2023-01-29 DIAGNOSIS — Z1211 Encounter for screening for malignant neoplasm of colon: Secondary | ICD-10-CM | POA: Diagnosis not present

## 2023-03-15 DIAGNOSIS — K635 Polyp of colon: Secondary | ICD-10-CM | POA: Diagnosis not present

## 2023-03-15 DIAGNOSIS — Z860100 Personal history of colon polyps, unspecified: Secondary | ICD-10-CM | POA: Diagnosis not present

## 2023-03-15 DIAGNOSIS — K6389 Other specified diseases of intestine: Secondary | ICD-10-CM | POA: Diagnosis not present

## 2023-03-15 DIAGNOSIS — K5730 Diverticulosis of large intestine without perforation or abscess without bleeding: Secondary | ICD-10-CM | POA: Diagnosis not present

## 2023-03-15 DIAGNOSIS — Z1211 Encounter for screening for malignant neoplasm of colon: Secondary | ICD-10-CM | POA: Diagnosis not present

## 2023-03-15 DIAGNOSIS — D120 Benign neoplasm of cecum: Secondary | ICD-10-CM | POA: Diagnosis not present

## 2023-03-15 DIAGNOSIS — Z860101 Personal history of adenomatous and serrated colon polyps: Secondary | ICD-10-CM | POA: Diagnosis not present

## 2023-03-15 DIAGNOSIS — D124 Benign neoplasm of descending colon: Secondary | ICD-10-CM | POA: Diagnosis not present

## 2023-03-15 LAB — HM COLONOSCOPY

## 2023-03-20 ENCOUNTER — Encounter: Payer: Self-pay | Admitting: Internal Medicine

## 2023-05-22 ENCOUNTER — Encounter: Payer: Self-pay | Admitting: Internal Medicine

## 2023-05-22 ENCOUNTER — Ambulatory Visit (INDEPENDENT_AMBULATORY_CARE_PROVIDER_SITE_OTHER): Payer: Medicare Other | Admitting: Internal Medicine

## 2023-05-22 VITALS — BP 138/86 | HR 83 | Temp 98.3°F | Ht 64.0 in | Wt 220.4 lb

## 2023-05-22 DIAGNOSIS — I10 Essential (primary) hypertension: Secondary | ICD-10-CM

## 2023-05-22 DIAGNOSIS — E66812 Obesity, class 2: Secondary | ICD-10-CM

## 2023-05-22 DIAGNOSIS — R7309 Other abnormal glucose: Secondary | ICD-10-CM | POA: Diagnosis not present

## 2023-05-22 DIAGNOSIS — Z6837 Body mass index (BMI) 37.0-37.9, adult: Secondary | ICD-10-CM

## 2023-05-22 DIAGNOSIS — E7800 Pure hypercholesterolemia, unspecified: Secondary | ICD-10-CM | POA: Diagnosis not present

## 2023-05-22 LAB — BMP8+EGFR
BUN/Creatinine Ratio: 11 — ABNORMAL LOW (ref 12–28)
BUN: 11 mg/dL (ref 8–27)
CO2: 21 mmol/L (ref 20–29)
Calcium: 9.2 mg/dL (ref 8.7–10.3)
Chloride: 105 mmol/L (ref 96–106)
Creatinine, Ser: 0.96 mg/dL (ref 0.57–1.00)
Glucose: 91 mg/dL (ref 70–99)
Potassium: 4.1 mmol/L (ref 3.5–5.2)
Sodium: 142 mmol/L (ref 134–144)
eGFR: 65 mL/min/{1.73_m2} (ref >59)

## 2023-05-22 LAB — HEMOGLOBIN A1C
Est. average glucose Bld gHb Est-mCnc: 123 mg/dL
Hgb A1c MFr Bld: 5.9 % — ABNORMAL HIGH (ref 4.8–5.6)

## 2023-05-22 MED ORDER — ATORVASTATIN CALCIUM 20 MG PO TABS: 20.0000 mg | ORAL_TABLET | Freq: Every day | ORAL | 1 refills | Status: DC

## 2023-05-22 MED ORDER — AMLODIPINE BESYLATE 10 MG PO TABS: 10.0000 mg | ORAL_TABLET | Freq: Every day | ORAL | 1 refills | Status: DC

## 2023-05-22 MED ORDER — VALSARTAN 160 MG PO TABS: 160.0000 mg | ORAL_TABLET | Freq: Every day | ORAL | 2 refills | Status: DC

## 2023-05-22 NOTE — Patient Instructions (Signed)
 Hypertension, Adult Hypertension is another name for high blood pressure. High blood pressure forces your heart to work harder to pump blood. This can cause problems over time. There are two numbers in a blood pressure reading. There is a top number (systolic) over a bottom number (diastolic). It is best to have a blood pressure that is below 120/80. What are the causes? The cause of this condition is not known. Some other conditions can lead to high blood pressure. What increases the risk? Some lifestyle factors can make you more likely to develop high blood pressure: Smoking. Not getting enough exercise or physical activity. Being overweight. Having too much fat, sugar, calories, or salt (sodium) in your diet. Drinking too much alcohol. Other risk factors include: Having any of these conditions: Heart disease. Diabetes. High cholesterol. Kidney disease. Obstructive sleep apnea. Having a family history of high blood pressure and high cholesterol. Age. The risk increases with age. Stress. What are the signs or symptoms? High blood pressure may not cause symptoms. Very high blood pressure (hypertensive crisis) may cause: Headache. Fast or uneven heartbeats (palpitations). Shortness of breath. Nosebleed. Vomiting or feeling like you may vomit (nauseous). Changes in how you see. Very bad chest pain. Feeling dizzy. Seizures. How is this treated? This condition is treated by making healthy lifestyle changes, such as: Eating healthy foods. Exercising more. Drinking less alcohol. Your doctor may prescribe medicine if lifestyle changes do not help enough and if: Your top number is above 130. Your bottom number is above 80. Your personal target blood pressure may vary. Follow these instructions at home: Eating and drinking  If told, follow the DASH eating plan. To follow this plan: Fill one half of your plate at each meal with fruits and vegetables. Fill one fourth of your plate  at each meal with whole grains. Whole grains include whole-wheat pasta, brown rice, and whole-grain bread. Eat or drink low-fat dairy products, such as skim milk or low-fat yogurt. Fill one fourth of your plate at each meal with low-fat (lean) proteins. Low-fat proteins include fish, chicken without skin, eggs, beans, and tofu. Avoid fatty meat, cured and processed meat, or chicken with skin. Avoid pre-made or processed food. Limit the amount of salt in your diet to less than 1,500 mg each day. Do not drink alcohol if: Your doctor tells you not to drink. You are pregnant, may be pregnant, or are planning to become pregnant. If you drink alcohol: Limit how much you have to: 0-1 drink a day for women. 0-2 drinks a day for men. Know how much alcohol is in your drink. In the [place] one drink equals one 12 oz bottle of beer (355 mL), one 5 oz glass of wine (148 mL), or one 1 oz glass of hard liquor (44 mL). Lifestyle  Work with your doctor to stay at a healthy weight or to lose weight. Ask your doctor what the best weight is for you. Get at least 30 minutes of exercise that causes your heart to beat faster (aerobic exercise) most days of the week. This may include walking, swimming, or biking. Get at least 30 minutes of exercise that strengthens your muscles (resistance exercise) at least 3 days a week. This may include lifting weights or doing Pilates. Do not smoke or use any products that contain nicotine or tobacco. If you need help quitting, ask your doctor. Check your blood pressure at home as told by your doctor. Keep all follow-up visits. Medicines Take over-the-counter and prescription medicines  only as told by your doctor. Follow directions carefully. Do not skip doses of blood pressure medicine. The medicine does not work as well if you skip doses. Skipping doses also puts you at risk for problems. Ask your doctor about side effects or reactions to medicines that you should watch  for. Contact a doctor if: You think you are having a reaction to the medicine you are taking. You have headaches that keep coming back. You feel dizzy. You have swelling in your ankles. You have trouble with your vision. Get help right away if: You get a very bad headache. You start to feel mixed up (confused). You feel weak or numb. You feel faint. You have very bad pain in your: Chest. Belly (abdomen). You vomit more than once. You have trouble breathing. These symptoms may be an emergency. Get help right away. Call 911. Do not wait to see if the symptoms will go away. Do not drive yourself to the hospital. Summary Hypertension is another name for high blood pressure. High blood pressure forces your heart to work harder to pump blood. For most people, a normal blood pressure is less than 120/80. Making healthy choices can help lower blood pressure. If your blood pressure does not get lower with healthy choices, you may need to take medicine. This information is not intended to replace advice given to you by your health care provider. Make sure you discuss any questions you have with your health care provider. Document Revised: 10/07/2020 Document Reviewed: 10/07/2020 Elsevier Patient Education  2024 [employer].

## 2023-05-22 NOTE — Progress Notes (Unsigned)
 I,Victoria T Basil Lim, CMA,acting as a Neurosurgeon for Smiley Dung, MD.,have documented all relevant documentation on the behalf of Smiley Dung, MD,as directed by  Smiley Dung, MD while in the presence of Smiley Dung, MD.  Subjective:  Patient ID: Carolyn Baker , female    DOB: 02/11/1955 , 68 y.o.   MRN: [id number]  Chief Complaint  Patient presents with   Hypertension    Pt presents today for a bp & cholesterol check. She reports compliance with medications. Denies headache chest pain & sob.     HPI Discussed the use of AI scribe software for clinical note transcription with the patient, who gave verbal consent to proceed.  History of Present Illness Carolyn Baker is a 68 year old female with hypertension who presents for a blood pressure check.  Her blood pressure readings have recently been in the 130s, an improvement from previous readings around 150 mmHg during clinic visits. She occasionally checks her blood pressure at home but not daily.  She is currently taking valsartan  160 mg daily, atorvastatin  20 mg, and amlodipine  10 mg. She has also been taking Zyrtec  occasionally due to pollen exposure, but it does not significantly bother her.  She has not been exercising recently due to the emotional challenge of a close friend's recent passing.  She has made dietary changes since May 2nd, eliminating sodas, french fries, and hamburgers, but has not noticed any weight loss yet.  She mentions a recent colonoscopy but does not provide further details about the results.     Hypertension This is a chronic problem. The current episode started more than 1 year ago. The problem has been gradually improving since onset. The problem is controlled. Pertinent negatives include no blurred vision or palpitations. Past treatments include angiotensin blockers and calcium  channel blockers. The current treatment provides moderate improvement. Compliance problems include exercise.       Past Medical History:  Diagnosis Date   Hypertension      Family History  Problem Relation Age of Onset   Stroke Mother    Dementia Mother    Prostate cancer Father    Bladder Cancer Maternal Grandmother    Breast cancer Maternal Aunt      Current Outpatient Medications:    cetirizine  (ZYRTEC  ALLERGY) 10 MG tablet, Take 1 tablet (10 mg total) by mouth daily., Disp: 30 tablet, Rfl: 2   Cholecalciferol (VITAMIN D3) 125 MCG (5000 UT) CAPS, Take by mouth., Disp: , Rfl:    nystatin  cream (MYCOSTATIN ), Apply 1 Application topically 2 (two) times daily. Please add use as needed, Disp: 45 g, Rfl: 1   amLODipine  (NORVASC ) 10 MG tablet, Take 1 tablet (10 mg total) by mouth daily., Disp: 90 tablet, Rfl: 1   atorvastatin  (LIPITOR) 20 MG tablet, Take 1 tablet (20 mg total) by mouth daily., Disp: 90 tablet, Rfl: 1   valsartan  (DIOVAN ) 160 MG tablet, Take 1 tablet (160 mg total) by mouth daily., Disp: 90 tablet, Rfl: 2   No Known Allergies   Review of Systems  Constitutional: Negative.   Eyes:  Negative for blurred vision.  Respiratory: Negative.    Cardiovascular: Negative.  Negative for palpitations.  Gastrointestinal: Negative.   Neurological: Negative.   Psychiatric/Behavioral: Negative.       Today's Vitals   05/22/23 1026  BP: 138/86  Pulse: 83  Temp: 98.3 F (36.8 C)  SpO2: 98%  Weight: 220 lb 6.4 oz (100 kg)  Height: 5\' 4"  (1.626 m)  Body mass index is 37.83 kg/m.  Wt Readings from Last 3 Encounters:  05/22/23 220 lb 6.4 oz (100 kg)  01/18/23 219 lb 6.4 oz (99.5 kg)  01/10/23 220 lb 9.6 oz (100.1 kg)    BP Readings from Last 3 Encounters:  05/22/23 138/86  01/18/23 130/84  01/10/23 126/70     Objective:  Physical Exam Vitals and nursing note reviewed.  Constitutional:      Appearance: Normal appearance. She is obese.  HENT:     Head: Normocephalic and atraumatic.  Eyes:     Extraocular Movements: Extraocular movements intact.  Cardiovascular:      Rate and Rhythm: Normal rate and regular rhythm.     Heart sounds: Normal heart sounds.  Pulmonary:     Effort: Pulmonary effort is normal.     Breath sounds: Normal breath sounds.  Musculoskeletal:     Cervical back: Normal range of motion.  Skin:    General: Skin is warm.  Neurological:     General: No focal deficit present.     Mental Status: She is alert.  Psychiatric:        Mood and Affect: Mood normal.        Behavior: Behavior normal.       Assessment And Plan:  Essential hypertension, benign Assessment & Plan: Chronic, not yet at goal.  Blood pressure remains above target. Recent readings in the 130s. Plans to resume water exercises. On valsartan  160 mg and amlodipine  10 mg daily. - Encourage regular exercise. - Refill valsartan  and amlodipine . - Advise regular home blood pressure monitoring.  Orders: -     BMP8+EGFR  Pure hypercholesterolemia Assessment & Plan: Chronic, encouraged to follow heart healthy lifestyle. She will continue with atorvastatin  20mg  daily.    Other abnormal glucose Assessment & Plan: Previous labs reviewed, her A1c has been elevated in the past. I will check an A1c today. Reminded to avoid refined sugars including sugary drinks/foods and processed meats including bacon, sausages and deli meats.    Orders: -     Hemoglobin A1c -     BMP8+EGFR  Class 2 severe obesity due to excess calories with serious comorbidity and body mass index (BMI) of 37.0 to 37.9 in adult [facility]) Assessment & Plan: She is encouraged to strive for BMI less than 30 to decrease cardiac risk. Advised to aim for at least 150 minutes of exercise per week.    Other orders -     amLODIPine  Besylate; Take 1 tablet (10 mg total) by mouth daily.  Dispense: 90 tablet; Refill: 1 -     Atorvastatin  Calcium ; Take 1 tablet (20 mg total) by mouth daily.  Dispense: 90 tablet; Refill: 1 -     Valsartan ; Take 1 tablet (160 mg total) by mouth daily.  Dispense: 90 tablet; Refill:  2   Return for 4 mon f/u for bpc.  Patient was given opportunity to ask questions. Patient verbalized understanding of the plan and was able to repeat key elements of the plan. All questions were answered to their satisfaction.    I, Smiley Dung, MD, have reviewed all documentation for this visit. The documentation on 05/22/23 for the exam, diagnosis, procedures, and orders are all accurate and complete.   IF YOU HAVE BEEN REFERRED TO A SPECIALIST, IT MAY TAKE 1-2 WEEKS TO SCHEDULE/PROCESS THE REFERRAL. IF YOU HAVE NOT HEARD FROM US /SPECIALIST IN TWO WEEKS, PLEASE GIVE US  A CALL AT [phone] X [phone].   THE PATIENT IS ENCOURAGED  TO PRACTICE SOCIAL DISTANCING DUE TO THE COVID-19 PANDEMIC.

## 2023-05-24 ENCOUNTER — Ambulatory Visit: Payer: Self-pay | Admitting: Internal Medicine

## 2023-05-27 NOTE — Assessment & Plan Note (Signed)
 Chronic, not yet at goal.  Blood pressure remains above target. Recent readings in the 130s. Plans to resume water exercises. On valsartan  160 mg and amlodipine  10 mg daily. - Encourage regular exercise. - Refill valsartan  and amlodipine . - Advise regular home blood pressure monitoring.

## 2023-05-27 NOTE — Assessment & Plan Note (Signed)
 Chronic, encouraged to follow heart healthy lifestyle. She will continue with atorvastatin  20mg  daily.

## 2023-05-27 NOTE — Assessment & Plan Note (Signed)
 She is encouraged to strive for BMI less than 30 to decrease cardiac risk. Advised to aim for at least 150 minutes of exercise per week.

## 2023-05-27 NOTE — Assessment & Plan Note (Signed)
 Previous labs reviewed, her A1c has been elevated in the past. I will check an A1c today. Reminded to avoid refined sugars including sugary drinks/foods and processed meats including bacon, sausages and deli meats.

## 2023-06-19 ENCOUNTER — Encounter: Admit: 2023-06-19 | Discharge: 2023-06-19 | Payer: MEDICARE

## 2023-06-25 ENCOUNTER — Encounter: Admit: 2023-06-25 | Discharge: 2023-06-25 | Payer: MEDICARE

## 2023-09-27 ENCOUNTER — Ambulatory Visit (INDEPENDENT_AMBULATORY_CARE_PROVIDER_SITE_OTHER): Payer: Medicare (Managed Care) | Admitting: Internal Medicine

## 2023-09-27 ENCOUNTER — Ambulatory Visit: Admitting: Internal Medicine

## 2023-09-27 ENCOUNTER — Encounter: Payer: Self-pay | Admitting: Internal Medicine

## 2023-09-27 VITALS — BP 130/86 | HR 72 | Temp 98.1°F | Ht 64.0 in | Wt 215.0 lb

## 2023-09-27 DIAGNOSIS — E7800 Pure hypercholesterolemia, unspecified: Secondary | ICD-10-CM

## 2023-09-27 DIAGNOSIS — E66812 Obesity, class 2: Secondary | ICD-10-CM | POA: Diagnosis not present

## 2023-09-27 DIAGNOSIS — R7309 Other abnormal glucose: Secondary | ICD-10-CM | POA: Diagnosis not present

## 2023-09-27 DIAGNOSIS — I10 Essential (primary) hypertension: Secondary | ICD-10-CM | POA: Diagnosis not present

## 2023-09-27 DIAGNOSIS — Z6836 Body mass index (BMI) 36.0-36.9, adult: Secondary | ICD-10-CM

## 2023-09-27 MED ORDER — AMLODIPINE BESYLATE 10 MG PO TABS: 10.0000 mg | ORAL_TABLET | Freq: Every day | ORAL | 1 refills | Status: AC

## 2023-09-27 MED ORDER — VALSARTAN 160 MG PO TABS: 160.0000 mg | ORAL_TABLET | Freq: Every day | ORAL | 2 refills | Status: AC

## 2023-09-27 MED ORDER — ATORVASTATIN CALCIUM 20 MG PO TABS: 20.0000 mg | ORAL_TABLET | Freq: Every day | ORAL | 1 refills | Status: AC

## 2023-09-27 NOTE — Assessment & Plan Note (Signed)
 Previous labs reviewed, her A1c has been elevated in the past. I will check an A1c today. Reminded to avoid refined sugars including sugary drinks/foods and processed meats including bacon, sausages and deli meats.

## 2023-09-27 NOTE — Patient Instructions (Signed)
 Hypertension, Adult Hypertension is another name for high blood pressure. High blood pressure forces your heart to work harder to pump blood. This can cause problems over time. There are two numbers in a blood pressure reading. There is a top number (systolic) over a bottom number (diastolic). It is best to have a blood pressure that is below 120/80. What are the causes? The cause of this condition is not known. Some other conditions can lead to high blood pressure. What increases the risk? Some lifestyle factors can make you more likely to develop high blood pressure: Smoking. Not getting enough exercise or physical activity. Being overweight. Having too much fat, sugar, calories, or salt (sodium) in your diet. Drinking too much alcohol. Other risk factors include: Having any of these conditions: Heart disease. Diabetes. High cholesterol. Kidney disease. Obstructive sleep apnea. Having a family history of high blood pressure and high cholesterol. Age. The risk increases with age. Stress. What are the signs or symptoms? High blood pressure may not cause symptoms. Very high blood pressure (hypertensive crisis) may cause: Headache. Fast or uneven heartbeats (palpitations). Shortness of breath. Nosebleed. Vomiting or feeling like you may vomit (nauseous). Changes in how you see. Very bad chest pain. Feeling dizzy. Seizures. How is this treated? This condition is treated by making healthy lifestyle changes, such as: Eating healthy foods. Exercising more. Drinking less alcohol. Your doctor may prescribe medicine if lifestyle changes do not help enough and if: Your top number is above 130. Your bottom number is above 80. Your personal target blood pressure may vary. Follow these instructions at home: Eating and drinking  If told, follow the DASH eating plan. To follow this plan: Fill one half of your plate at each meal with fruits and vegetables. Fill one fourth of your plate  at each meal with whole grains. Whole grains include whole-wheat pasta, brown rice, and whole-grain bread. Eat or drink low-fat dairy products, such as skim milk or low-fat yogurt. Fill one fourth of your plate at each meal with low-fat (lean) proteins. Low-fat proteins include fish, chicken without skin, eggs, beans, and tofu. Avoid fatty meat, cured and processed meat, or chicken with skin. Avoid pre-made or processed food. Limit the amount of salt in your diet to less than 1,500 mg each day. Do not drink alcohol if: Your doctor tells you not to drink. You are pregnant, may be pregnant, or are planning to become pregnant. If you drink alcohol: Limit how much you have to: 0-1 drink a day for women. 0-2 drinks a day for men. Know how much alcohol is in your drink. In the [place] one drink equals one 12 oz bottle of beer (355 mL), one 5 oz glass of wine (148 mL), or one 1 oz glass of hard liquor (44 mL). Lifestyle  Work with your doctor to stay at a healthy weight or to lose weight. Ask your doctor what the best weight is for you. Get at least 30 minutes of exercise that causes your heart to beat faster (aerobic exercise) most days of the week. This may include walking, swimming, or biking. Get at least 30 minutes of exercise that strengthens your muscles (resistance exercise) at least 3 days a week. This may include lifting weights or doing Pilates. Do not smoke or use any products that contain nicotine or tobacco. If you need help quitting, ask your doctor. Check your blood pressure at home as told by your doctor. Keep all follow-up visits. Medicines Take over-the-counter and prescription medicines  only as told by your doctor. Follow directions carefully. Do not skip doses of blood pressure medicine. The medicine does not work as well if you skip doses. Skipping doses also puts you at risk for problems. Ask your doctor about side effects or reactions to medicines that you should watch  for. Contact a doctor if: You think you are having a reaction to the medicine you are taking. You have headaches that keep coming back. You feel dizzy. You have swelling in your ankles. You have trouble with your vision. Get help right away if: You get a very bad headache. You start to feel mixed up (confused). You feel weak or numb. You feel faint. You have very bad pain in your: Chest. Belly (abdomen). You vomit more than once. You have trouble breathing. These symptoms may be an emergency. Get help right away. Call 911. Do not wait to see if the symptoms will go away. Do not drive yourself to the hospital. Summary Hypertension is another name for high blood pressure. High blood pressure forces your heart to work harder to pump blood. For most people, a normal blood pressure is less than 120/80. Making healthy choices can help lower blood pressure. If your blood pressure does not get lower with healthy choices, you may need to take medicine. This information is not intended to replace advice given to you by your health care provider. Make sure you discuss any questions you have with your health care provider. Document Revised: 10/07/2020 Document Reviewed: 10/07/2020 Elsevier Patient Education  2024 [employer].

## 2023-09-27 NOTE — Progress Notes (Signed)
 I,Carolyn Baker, CMA,acting as a neurosurgeon for Carolyn LOISE Slocumb, MD.,have documented all relevant documentation on the behalf of Carolyn LOISE Slocumb, MD,as directed by  Carolyn LOISE Slocumb, MD while in the presence of Carolyn LOISE Slocumb, MD.  Subjective:  Patient ID: Carolyn Baker , female    DOB: 05-01-1955 , 68 y.o.   MRN: [id number]  Chief Complaint  Patient presents with   Hypertension    Patient presents today for bp & cholesterol follow up. She reports compliance with medications. Denies headache, chest pain & sob.   Hyperlipidemia    HPI Discussed the use of AI scribe software for clinical note transcription with the patient, who gave verbal consent to proceed.  History of Present Illness Carolyn Baker is a 68 year old female with hypertension who presents for a blood pressure check.  She reported that her blood pressure 'just went down.' She is currently taking amlodipine  10 mg, atorvastatin  20 mg, and valsartan  160 mg as part of her medication regimen. She mentioned running out of one of her medications but is unsure which one, and she has not been picking up her medications regularly from the pharmacy.  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 Recently, his son has taken over his care, allowing her to focus more on herself. She has been involved in managing his finances and errands but plans to remove her name from his bank account.  She drinks plenty of water but does not engage in regular exercise. She has lost five pounds since her last visit in May. She has not received a flu shot and does not regularly use MyChart for her medical records.  Her mother has dementia and a history of stroke, but is reportedly in better shape than herself.     Hypertension This is a chronic problem. The current episode started more than 1 year ago. The problem has  been gradually improving since onset. The problem is controlled. Pertinent negatives include no blurred vision or palpitations. Past treatments include angiotensin blockers and calcium  channel blockers. The current treatment provides moderate improvement. Compliance problems include exercise.      Past Medical History:  Diagnosis Date   Hypertension      Family History  Problem Relation Age of Onset   Stroke Mother    Dementia Mother    Prostate cancer Father    Bladder Cancer Maternal Grandmother    Breast cancer Maternal Aunt      Current Outpatient Medications:    cetirizine  (ZYRTEC  ALLERGY) 10 MG tablet, Take 1 tablet (10 mg total) by mouth daily., Disp: 30 tablet, Rfl: 2   Cholecalciferol (VITAMIN D3) 125 MCG (5000 UT) CAPS, Take by mouth., Disp: , Rfl:    nystatin  cream (MYCOSTATIN ), Apply 1 Application topically 2 (two) times daily. Please add use as needed, Disp: 45 g, Rfl: 1   amLODipine  (NORVASC ) 10 MG tablet, Take 1 tablet (10 mg total) by mouth daily., Disp: 90 tablet, Rfl: 1   atorvastatin  (LIPITOR) 20 MG tablet, Take 1 tablet (20 mg total) by mouth daily., Disp: 90 tablet, Rfl: 1   valsartan  (DIOVAN ) 160 MG tablet, Take 1 tablet (160 mg total) by mouth daily., Disp: 90 tablet, Rfl: 2   No Known Allergies   Review of Systems  Constitutional: Negative.   Eyes:  Negative for blurred  vision.  Respiratory: Negative.    Cardiovascular: Negative.  Negative for palpitations.  Gastrointestinal: Negative.   Neurological: Negative.   Psychiatric/Behavioral: Negative.       Today's Vitals   09/27/23 1520 09/27/23 1535  BP: (!) 142/90 130/86  Pulse: 72   Temp: 98.1 F (36.7 C)   SpO2: 98%   Weight: 215 lb (97.5 kg)   Height: 5' 4 (1.626 m)    Body mass index is 36.9 kg/m.  Wt Readings from Last 3 Encounters:  09/27/23 215 lb (97.5 kg)  05/22/23 220 lb 6.4 oz (100 kg)  01/18/23 219 lb 6.4 oz (99.5 kg)    BP Readings from Last 3 Encounters:  09/27/23 130/86   05/22/23 138/86  01/18/23 130/84     Objective:  Physical Exam Vitals and nursing note reviewed.  Constitutional:      Appearance: Normal appearance. She is obese.  HENT:     Head: Normocephalic and atraumatic.  Eyes:     Extraocular Movements: Extraocular movements intact.  Cardiovascular:     Rate and Rhythm: Normal rate and regular rhythm.     Heart sounds: Normal heart sounds.  Pulmonary:     Effort: Pulmonary effort is normal.     Breath sounds: Normal breath sounds.  Musculoskeletal:     Cervical back: Normal range of motion.  Skin:    General: Skin is warm.  Neurological:     General: No focal deficit present.     Mental Status: She is alert.  Psychiatric:        Mood and Affect: Mood normal.        Behavior: Behavior normal.         Assessment And Plan:  Essential hypertension, benign Assessment & Plan: Chronic, improved control.  Goal BP<130/80.  Blood pressure improved with valsartan  and amlodipine  regimen. - Refill amlodipine  and valsartan . - Continue valsartan  in the morning, amlodipine  at night. - Emphasized regular medication adherence to prevent complications. - She will f/u in four to six months  Orders: -     CMP14+EGFR  Pure hypercholesterolemia Assessment & Plan: Chronic, encouraged to follow heart healthy lifestyle. She will continue with atorvastatin  20mg  daily.     Orders: -     CMP14+EGFR  Other abnormal glucose Assessment & Plan: Previous labs reviewed, her A1c has been elevated in the past. I will check an A1c today. Reminded to avoid refined sugars including sugary drinks/foods and processed meats including bacon, sausages and deli meats.    Orders: -     CMP14+EGFR -     Hemoglobin A1c  Class 2 severe obesity due to excess calories with serious comorbidity and body mass index (BMI) of 36.0 to 36.9 in adult Assessment & Plan: She is encouraged to strive for BMI less than 30 to decrease cardiac risk. Advised to aim for at  least 150 minutes of exercise per week.    Other orders -     amLODIPine  Besylate; Take 1 tablet (10 mg total) by mouth daily.  Dispense: 90 tablet; Refill: 1 -     Valsartan ; Take 1 tablet (160 mg total) by mouth daily.  Dispense: 90 tablet; Refill: 2 -     Atorvastatin  Calcium ; Take 1 tablet (20 mg total) by mouth daily.  Dispense: 90 tablet; Refill: 1   Return for 3 MONTH BPC.  Patient was given opportunity to ask questions. Patient verbalized understanding of the plan and was able to repeat key elements of the plan. All questions  were answered to their satisfaction.   I, Carolyn LOISE Slocumb, MD, have reviewed all documentation for this visit. The documentation on 09/27/23 for the exam, diagnosis, procedures, and orders are all accurate and complete.  IF YOU HAVE BEEN REFERRED TO A SPECIALIST, IT MAY TAKE 1-2 WEEKS TO SCHEDULE/PROCESS THE REFERRAL. IF YOU HAVE NOT HEARD FROM US /SPECIALIST IN TWO WEEKS, PLEASE GIVE US  A CALL AT [phone].   THE PATIENT IS ENCOURAGED TO PRACTICE SOCIAL DISTANCING DUE TO THE COVID-19 PANDEMIC.

## 2023-09-28 LAB — CMP14+EGFR
ALT: 26 IU/L (ref 0–32)
AST: 23 IU/L (ref 0–40)
Albumin: 4.1 g/dL (ref 3.9–4.9)
Alkaline Phosphatase: 65 IU/L (ref 49–135)
BUN/Creatinine Ratio: 15 (ref 12–28)
BUN: 16 mg/dL (ref 8–27)
Bilirubin Total: 0.7 mg/dL (ref 0.0–1.2)
CO2: 22 mmol/L (ref 20–29)
Calcium: 9.8 mg/dL (ref 8.7–10.3)
Chloride: 103 mmol/L (ref 96–106)
Creatinine, Ser: 1.04 mg/dL — ABNORMAL HIGH (ref 0.57–1.00)
Globulin, Total: 3.6 g/dL (ref 1.5–4.5)
Glucose: 90 mg/dL (ref 70–99)
Potassium: 4.2 mmol/L (ref 3.5–5.2)
Sodium: 142 mmol/L (ref 134–144)
Total Protein: 7.7 g/dL (ref 6.0–8.5)
eGFR: 59 mL/min/1.73 — ABNORMAL LOW (ref >59)

## 2023-09-28 LAB — HEMOGLOBIN A1C
Est. average glucose Bld gHb Est-mCnc: 123 mg/dL
Hgb A1c MFr Bld: 5.9 % — ABNORMAL HIGH (ref 4.8–5.6)

## 2023-10-01 ENCOUNTER — Ambulatory Visit: Payer: Self-pay | Admitting: Internal Medicine

## 2023-10-01 DIAGNOSIS — N1831 Chronic kidney disease, stage 3a: Secondary | ICD-10-CM

## 2023-10-05 NOTE — Assessment & Plan Note (Signed)
 Chronic, improved control.  Goal BP<130/80.  Blood pressure improved with valsartan  and amlodipine  regimen. - Refill amlodipine  and valsartan . - Continue valsartan  in the morning, amlodipine  at night. - Emphasized regular medication adherence to prevent complications. - She will f/u in four to six months

## 2023-10-05 NOTE — Assessment & Plan Note (Signed)
 Chronic, encouraged to follow heart healthy lifestyle. She will continue with atorvastatin  20mg  daily.

## 2023-10-05 NOTE — Assessment & Plan Note (Signed)
 She is encouraged to strive for BMI less than 30 to decrease cardiac risk. Advised to aim for at least 150 minutes of exercise per week.

## 2023-10-10 ENCOUNTER — Ambulatory Visit: Payer: Self-pay

## 2023-10-26 ENCOUNTER — Ambulatory Visit
Admission: RE | Admit: 2023-10-26 | Discharge: 2023-10-26 | Disposition: A | Discharge status: Home / Self Care | Payer: Medicare (Managed Care) | Source: Ambulatory Visit | Attending: Internal Medicine | Admitting: Internal Medicine

## 2023-10-26 DIAGNOSIS — N1831 Chronic kidney disease, stage 3a: Secondary | ICD-10-CM | POA: Insufficient documentation

## 2023-10-30 ENCOUNTER — Ambulatory Visit: Payer: Self-pay | Admitting: Internal Medicine

## 2023-12-28 ENCOUNTER — Encounter (HOSPITAL_COMMUNITY): Payer: Self-pay | Admitting: Emergency Medicine

## 2023-12-28 ENCOUNTER — Other Ambulatory Visit: Payer: Self-pay

## 2023-12-28 ENCOUNTER — Emergency Department (HOSPITAL_COMMUNITY)
Admission: EM | Admit: 2023-12-28 | Discharge: 2023-12-28 | Disposition: A | Discharge status: Home / Self Care | Payer: Medicare (Managed Care) | Attending: Emergency Medicine | Admitting: Emergency Medicine

## 2023-12-28 DIAGNOSIS — R040 Epistaxis: Secondary | ICD-10-CM | POA: Diagnosis not present

## 2023-12-28 MED ORDER — OXYMETAZOLINE HCL 0.05 % NA SOLN
1.0000 | Freq: Once | NASAL | Status: AC
  Administered 2023-12-28: 1 via NASAL
  Filled 2023-12-28: qty 15

## 2023-12-28 NOTE — Discharge Instructions (Signed)
 If your nose starts to bleed again: Blow your nose to remove any clots Spray the Afrin in the nose. Hold pressure- pinch the soft part of the nose shut and hold constant pressure for 15 minutes. If bleeding continues, return to the ER.  Follow up with ENT, please call to schedule an appointment.  TRY not to blow or rub your nose.

## 2023-12-28 NOTE — ED Provider Notes (Signed)
 " [place] [facility] AT [facility] Provider Note   CSN: [id number] Arrival date & time: 12/28/23  0104     Patient presents with: Epistaxis   Carolyn Baker is a 68 y.o. female.   68 year old female presents with complaint of left side nose bleed. States she had a nosebleed last Tuesday that stopped at home.  Tonight, she went to blow her nose when she developed left side nosebleed again.  Patient called her daughter who instructed her to hold pressure on the nose which seem to stop the bleeding.  Bleeding resumed 1 hour later and continued for her drive to the emergency room.  Bleeding stopped on arrival.  Patient is not anticoagulated.  She denies any injury to the nose.  Not on any nasal sprays.  No other complaints or concerns.       Prior to Admission medications  Medication Sig Start Date End Date Taking? Authorizing Provider  amLODipine  (NORVASC ) 10 MG tablet Take 1 tablet (10 mg total) by mouth daily. 09/27/23 09/26/24  Jarold Medici, MD  atorvastatin  (LIPITOR) 20 MG tablet Take 1 tablet (20 mg total) by mouth daily. 09/27/23   Jarold Medici, MD  cetirizine  (ZYRTEC  ALLERGY) 10 MG tablet Take 1 tablet (10 mg total) by mouth daily. 07/11/22 09/27/23  Jarold Medici, MD  Cholecalciferol (VITAMIN D3) 125 MCG (5000 UT) CAPS Take by mouth.    [provider]  nystatin  cream (MYCOSTATIN ) Apply 1 Application topically 2 (two) times daily. Please add use as needed 06/16/21   Jarold Medici, MD  valsartan  (DIOVAN ) 160 MG tablet Take 1 tablet (160 mg total) by mouth daily. 09/27/23   Jarold Medici, MD    Allergies: Patient has no known allergies.    Review of Systems Negative except as per HPI Updated Vital Signs BP (!) 176/89 (BP Location: Right Arm)   Pulse (!) 114   Temp 97.6 F (36.4 C)   Resp 20   SpO2 98%   Physical Exam Vitals and nursing note reviewed.  Constitutional:      General: She is not in acute distress.    Appearance: She is  well-developed. She is not diaphoretic.  HENT:     Head: Normocephalic and atraumatic.     Comments: Dried blood in left nostril without obvious source of bleeding, no active bleeding    Mouth/Throat:     Mouth: Mucous membranes are moist.     Pharynx: No oropharyngeal exudate or posterior oropharyngeal erythema.  Pulmonary:     Effort: Pulmonary effort is normal.  Skin:    General: Skin is warm and dry.     Findings: No erythema or rash.  Neurological:     Mental Status: She is alert and oriented to person, place, and time.  Psychiatric:        Behavior: Behavior normal.     (all labs ordered are listed, but only abnormal results are displayed) Labs Reviewed - No data to display  EKG: None  Radiology: No results found.   Procedures   Medications Ordered in the ED  oxymetazoline  (AFRIN) 0.05 % nasal spray 1 spray (has no administration in time range)    Clinical Course as of 12/28/23 0227  Fri Dec 28, 2023  0225 BP 145/92 [LM]    Clinical Course User Index [LM] Carolyn Leita LABOR, PA-C  Medical Decision Making Risk OTC drugs.   68 year old female presents with concern for left-sided nosebleed as above.  Bleeding has stopped since arrival in the emergency department.  There is no active bleeding at this time.  Patient was observed for an hour and a half without any reoccurrence.  Discussed management at home.  Referred to ENT for follow-up.     Final diagnoses:  Left-sided epistaxis    ED Discharge Orders     None          Carolyn Leita Carolyn Baker 12/28/23 [id number]    Jerral Meth, MD 12/28/23 [phone]  "

## 2023-12-28 NOTE — ED Triage Notes (Signed)
 Patient reports left side nose bleeding onset this evening , denies injury , no anticoagulant medication .

## 2024-01-02 ENCOUNTER — Ambulatory Visit (INDEPENDENT_AMBULATORY_CARE_PROVIDER_SITE_OTHER): Payer: Medicare (Managed Care)

## 2024-01-02 ENCOUNTER — Encounter (INDEPENDENT_AMBULATORY_CARE_PROVIDER_SITE_OTHER): Payer: Self-pay

## 2024-01-02 VITALS — BP 136/94 | HR 112 | Temp 98.0°F | Wt 215.0 lb

## 2024-01-02 DIAGNOSIS — H9313 Tinnitus, bilateral: Secondary | ICD-10-CM

## 2024-01-02 DIAGNOSIS — R040 Epistaxis: Secondary | ICD-10-CM

## 2024-01-02 DIAGNOSIS — F40240 Claustrophobia: Secondary | ICD-10-CM | POA: Diagnosis not present

## 2024-01-02 DIAGNOSIS — H9190 Unspecified hearing loss, unspecified ear: Secondary | ICD-10-CM

## 2024-01-02 MED ORDER — ALPRAZOLAM 0.5 MG PO TABS: 0.5000 mg | ORAL_TABLET | Freq: Once | ORAL | 0 refills | Status: AC

## 2024-01-02 MED ORDER — SALINE SPRAY 0.65 % NA SOLN: 1.0000 | Freq: Four times a day (QID) | NASAL | 0 refills | Status: AC

## 2024-01-02 MED ORDER — MUPIROCIN 2 % EX OINT: 1.0000 | TOPICAL_OINTMENT | Freq: Two times a day (BID) | CUTANEOUS | 0 refills | Status: AC

## 2024-01-02 NOTE — Patient Instructions (Signed)
 Epistaxis nosebleed is common and usually not dangerous. Most nosebleeds can be managed at home but some may need medical attention.      **How to stop an active nosebleed**   - Sit up and lean forward slightly. This keeps blood from going down the throat, which can cause nausea or vomiting.   - Pinch the soft part of the nose just below the bony bridge firmly for 15 to 20 minutes without letting go. Breathe through the mouth and spit out any blood.   - A nasal clamp (nose clip) can be used to apply pressure to the lower third of the nose. Finger pressure is usually enough.   - For active bleeding, use Afrin (oxymetazoline ) nasal spray in the bleeding nostril before applying pressure.   - If bleeding does not stop after 20 minutes, or if the bleeding is heavy, seek medical care.      **If nasal tampons or packing were placed**   - Nonresorbable packing (not dissolvable) must be removed by a healthcare provider after 48 to 72 hours.   - Resorbable packing (dissolvable) may need follow-up within 1 week to check for any remaining material.   - Keep the nose and packing moist with frequent nasal saline spray to help healing and reduce crusting.   - Do not blow the nose, pick at the packing, or try to remove it yourself.   - If packing is secured with a clip at the nostril, be careful to avoid pressure sores on the skin.      **How to prevent future nosebleeds**   - Do not pick the nose or put tissues in the nose except for gentle dabbing.   - Avoid blowing the nose forcefully for at least 1 week after a nosebleed or packing.   - Use nasal saline spray every 4 to 6 hours as needed to keep the inside of the nose moist.   - Apply over-the-counter nasal saline gel (such as Ayr Gel) to the nostrils (use a pea-sized amount on a pinkie finger and put it on the nostril, not the middle part of the septum), then gently pinch the nostril. Do this at least 3 times per day, but it can be done more often.   -  Use a humidifier in the bedroom at night to add moisture to the air.   - Maintain tight blood pressure control, as high blood pressure increases the risk of nosebleeds.   - If taking blood thinners or aspirin , discuss with the healthcare provider whether these should be adjusted.      **Other tips**   - Use acetaminophen (Tylenol) for pain. Avoid aspirin  and ibuprofen unless advised by a healthcare provider, as they can increase bleeding risk.   - If sneezing, try to sneeze with the mouth open to reduce pressure in the nose.   - If using oxygen or CPAP, make sure the device is humidified.      **When to seek medical attention**   - Bleeding does not stop after 20 minutes of firm pressure.   - Bleeding is very heavy or goes down the throat.   - Difficulty breathing, fainting, or signs of shock (pale, weak, confused).   - If packing falls out or causes severe pain, fever, or foul smell.   - Repeated nosebleeds, especially if on blood thinners or with other health problems.      **Follow-up**   - Attend all scheduled follow-up appointments for packing removal and to check  healing.     - If nosebleeds keep happening, further tests may be needed to look for other causes.      Most nosebleeds can be managed at home with pressure, nasal sprays, and good nasal hygiene. Preventing dryness and trauma to the nose, controlling blood pressure, and following instructions for packing are key to reducing future episodes.

## 2024-01-03 DEATH — deceased

## 2024-01-04 NOTE — Progress Notes (Signed)
 Dear Dr. Jarold, Here is my assessment for our mutual patient, Carolyn Baker. Thank you for allowing me the opportunity to care for your patient. Please do not hesitate to contact me should you have any other questions. Sincerely, Dr. Penne Croak  Otolaryngology Clinic Note Referring provider: Dr. Jarold HPI:  Discussed the use of AI scribe software for clinical note transcription with the patient, who gave verbal consent to proceed.  History of Present Illness Carolyn Baker is a 69 year old female with hypertension who presents with recurrent epistaxis.  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 - Current tinnitus described as mild  Hearing loss - Progressive hearing loss, more pronounced in the left ear - Difficulty understanding speech in noisy environments - No audiometric evaluation in approximately seven years  Claustrophobia and anxiety with enclosed spaces - Significant claustrophobia and anxiety in enclosed spaces - Previous audiometric testing resulted in elevated blood pressure and panic - Claustrophobia has prevented recent audiometric evaluation   Independent Review of Additional Tests or Records:  Reviewed external note from referring PCP, Sanders,describing relevant history incorporated into todays evaluation.   PMH/Meds/All/SocHx/FamHx/ROS:   Past Medical History:   Diagnosis Date   Hypertension      Past Surgical History:  Procedure Laterality Date   PARTIAL HYSTERECTOMY  1998    Family History  Problem Relation Age of Onset   Stroke Mother    Dementia Mother    Prostate cancer Father    Bladder Cancer Maternal Grandmother    Breast cancer Maternal Aunt      Social Connections: Moderately Isolated (01/10/2023)   Social Connection and Isolation Panel    Frequency of Communication with Friends and Family: More than three times a week    Frequency of Social Gatherings with Friends and Family: More than three times a week    Attends Religious Services: More than 4 times per year    Active Member of [employer] or Organizations: No    Attends Engineer, Structural: Never    Marital Status: Never married     Current Medications[1]   Physical Exam:   BP (!) 136/94 (BP Location: Left Arm, Patient Position: Sitting, Cuff Size: Normal)   Pulse (!) 112   Temp 98 F (36.7 C)   Wt 215 lb (97.5 kg)   SpO2 94%   BMI 36.90 kg/m   The patient was awake, alert, and appropriate. The external ears were inspected, and otoscopy was performed to evaluate the external auditory canals and tympanic membranes. The nasal cavity and septum were examined for mucosal changes, obstruction, or discharge. The oral cavity and oropharynx were inspected for mucosal lesions, infection, or tonsillar hypertrophy. The neck was palpated for lymphadenopathy, thyroid  abnormalities, or other masses. Cranial nerve function was grossly intact.  Pertinent Findings: General: Well developed, well nourished. No acute distress. Voice without hoarseness Head/Face: Normocephalic. No sinus tenderness. Facial nerve intact and equal bilaterally. No facial lacerations. Eyes: PERRL, no scleral icterus or conjunctival  hemorrhage. EOMI. Ears: No gross deformity. Normal external canal. Tympanic membrane with normal landmarks bilaterally Hearing: Normal speech reception.  Nose: see  below Mouth/Oropharynx: Lips without any lesions. Dentition . No mucosal lesions within the oropharynx. No tonsillar enlargement, exudate, or lesions. Pharyngeal walls symmetrical. Uvula midline. Tongue midline without lesions. Larynx: See TFL if applicable Nasopharynx: See TFL if applicable Neck: Trachea midline. No masses. No thyromegaly or nodules palpated. No crepitus. Lymphatic: No lymphadenopathy in the neck. Respiratory: No stridor or distress. Room air. Cardiovascular: Regular rate and rhythm. Extremities: No edema or cyanosis. Warm and well-perfused. Skin: No scars or lesions on face or neck. Neurologic: CN II-XII grossly intact. Moving all extremities without gross abnormality. Other:  Physical Exam HEENT: Atraumatic, normocephalic. Nose with a small scab on the right side -eschar consistent with right sided epistaxis. Throat examination unremarkable.   Seprately Identifiable Procedures:  I personally ordered, reviewed and interpreted the following with the patient today  Procedure: Bilateral ear microscopy using microscope (CPT [phone]) Pre-procedure diagnosis: hearing loss and tinnitus Post-procedure diagnosis: same Indication: see above; given patient's otologic complaints and history, for improved and comprehensive examination of external ear and tympanic membrane, bilateral otologic examination using microscope was performed. Prior to proceeding, verbal consent was obtained after discussion of R/B/A  Procedure: Patient was placed semi-recumbent. Both ear canals were examined using the microscope with findings below. Patient tolerated the procedure well.  Right ear:  No significant lesions pinna. EAC: no significant lesions. Canal is clear. Eczematoid changes. minimal TM: Intact   Left ear:  No significant lesions pinna. EAC: no significant lesions. Canal is clear. Eczematoid changes. minimal TM: Intact   Impression & Plans:  Carolyn Baker is a 69 y.o. female  1.  Epistaxis   2. Tinnitus of both ears   3. Claustrophobia    - Findings and diagnoses discussed in detail with the patient. - Risks, benefits, and alternatives were reviewed. Through shared decision making, the patient elects to proceed with below.  Assessment and Plan Assessment & Plan Epistaxis Recurrent anterior epistaxis likely due to superficial vessel exposure and mucosal dryness, with possible contribution from suboptimal hydration and hypertension. No posterior source or coagulopathy. - Prescribed intranasal ointment twice daily, then once daily as symptoms improve. - Recommended saline nasal spray during the day to maintain mucosal moisture. - Advised against nose blowing for one week following an episode. - Provided education on proper technique for controlling active epistaxis: pinch nose for 15 minutes without interruption. - Discussed use of nasal clamp for acute episodes; recommended obtaining one. - Advised to increase daily water intake to 60-80 ounces. - Scheduled follow-up in 4-6 weeks to reassess epistaxis and consider further evaluation if symptoms persist. - If bleeding persists, discussed chemical cauterization with silver nitrate as next step. - If ongoing bleeding at follow-up, discussed possible nasal endoscopy to evaluate for posterior source.  Tinnitus and hearing loss Longstanding bilateral tinnitus and hearing loss, left greater than right, likely secondary to occupational noise exposure. No acute changes. Last audiogram seven years ago. - Scheduled audiogram in 4-6 weeks to reassess hearing status. - Prescribed single dose of alprazolam to be taken 30 minutes prior to audiometric testing to address claustrophobia; advised to take half the tablet initially and bring the other half in case additional dosing is needed.  Claustrophobia Claustrophobia in enclosed spaces such as audiology booths, previously causing anxiety and elevated blood pressure during  testing. - Prescribed single dose of alprazolam for use prior to audiology booth testing;  instructed to take half the tablet before leaving home and bring the other half to the appointment in case further dosing is needed. - Advised to arrange for a driver on the day of the hearing test due to anxiolytic use.  - Orders placed:  Orders Placed This Encounter  Procedures   Ambulatory referral to Audiology   - Medications prescribed/continued/adjusted:  Meds ordered this encounter  Medications   mupirocin ointment (BACTROBAN) 2 %    Sig: Apply 1 Application topically 2 (two) times daily. Apply small amount inside nose one week then as needed    Dispense:  22 g    Refill:  0   sodium chloride (OCEAN) 0.65 % SOLN nasal spray    Sig: Place 1 spray into both nostrils 4 (four) times daily.    Dispense:  88 mL    Refill:  0   ALPRAZolam (XANAX) 0.5 MG tablet    Sig: Take 1 tablet (0.5 mg total) by mouth once for 1 dose.    Dispense:  1 tablet    Refill:  0   - Education materials provided to the patient. - Follow up: 4 weeks with audio. Patient instructed to return sooner or go to the ED if new/worsening symptoms develop.  Thank you for allowing me the opportunity to care for your patient. Please do not hesitate to contact me should you have any other questions.  Sincerely, Penne Croak, DO Otolaryngologist (ENT) [facility] Phone: [phone] Fax: [phone]  01/04/2024, 1:32 PM        [1]  Current Outpatient Medications:    amLODipine  (NORVASC ) 10 MG tablet, Take 1 tablet (10 mg total) by mouth daily., Disp: 90 tablet, Rfl: 1   atorvastatin  (LIPITOR) 20 MG tablet, Take 1 tablet (20 mg total) by mouth daily., Disp: 90 tablet, Rfl: 1   Cholecalciferol (VITAMIN D3) 125 MCG (5000 UT) CAPS, Take by mouth., Disp: , Rfl:    mupirocin ointment (BACTROBAN) 2 %, Apply 1 Application topically 2 (two) times daily. Apply small amount inside nose one week then as needed,  Disp: 22 g, Rfl: 0   nystatin  cream (MYCOSTATIN ), Apply 1 Application topically 2 (two) times daily. Please add use as needed, Disp: 45 g, Rfl: 1   sodium chloride (OCEAN) 0.65 % SOLN nasal spray, Place 1 spray into both nostrils 4 (four) times daily., Disp: 88 mL, Rfl: 0   valsartan  (DIOVAN ) 160 MG tablet, Take 1 tablet (160 mg total) by mouth daily., Disp: 90 tablet, Rfl: 2   cetirizine  (ZYRTEC  ALLERGY) 10 MG tablet, Take 1 tablet (10 mg total) by mouth daily. (Patient not taking: Reported on 01/02/2024), Disp: 30 tablet, Rfl: 2

## 2024-01-22 ENCOUNTER — Encounter: Payer: Self-pay | Admitting: Internal Medicine

## 2024-01-23 ENCOUNTER — Ambulatory Visit: Payer: Self-pay

## 2024-01-23 ENCOUNTER — Ambulatory Visit: Payer: Medicare Other

## 2024-02-04 ENCOUNTER — Ambulatory Visit (INDEPENDENT_AMBULATORY_CARE_PROVIDER_SITE_OTHER): Payer: Medicare (Managed Care)

## 2024-02-04 ENCOUNTER — Ambulatory Visit (INDEPENDENT_AMBULATORY_CARE_PROVIDER_SITE_OTHER): Payer: Medicare (Managed Care) | Admitting: Audiology

## 2024-03-19 ENCOUNTER — Ambulatory Visit: Payer: Medicare (Managed Care)

## 2024-03-21 ENCOUNTER — Ambulatory Visit (INDEPENDENT_AMBULATORY_CARE_PROVIDER_SITE_OTHER): Payer: Medicare (Managed Care)

## 2024-03-21 ENCOUNTER — Ambulatory Visit (INDEPENDENT_AMBULATORY_CARE_PROVIDER_SITE_OTHER): Payer: Medicare (Managed Care) | Admitting: Audiology

## 2024-05-01 ENCOUNTER — Encounter: Payer: Self-pay | Admitting: Internal Medicine
